# Patient Record
Sex: Female | Born: 1945 | Hispanic: No | Marital: Married | Smoking: Never smoker
Health system: Southern US, Community
[De-identification: ages and names within clinical notes are randomized; demographics above are authoritative.]

## PROBLEM LIST (undated history)

## (undated) DIAGNOSIS — F32A Depression, unspecified: Secondary | ICD-10-CM

## (undated) DIAGNOSIS — K7689 Other specified diseases of liver: Secondary | ICD-10-CM

## (undated) DIAGNOSIS — C7A092 Malignant carcinoid tumor of the stomach: Secondary | ICD-10-CM

## (undated) DIAGNOSIS — E039 Hypothyroidism, unspecified: Secondary | ICD-10-CM

## (undated) DIAGNOSIS — F329 Major depressive disorder, single episode, unspecified: Secondary | ICD-10-CM

## (undated) DIAGNOSIS — I272 Pulmonary hypertension, unspecified: Secondary | ICD-10-CM

## (undated) DIAGNOSIS — R55 Syncope and collapse: Secondary | ICD-10-CM

## (undated) DIAGNOSIS — E785 Hyperlipidemia, unspecified: Secondary | ICD-10-CM

## (undated) DIAGNOSIS — D649 Anemia, unspecified: Secondary | ICD-10-CM

## (undated) DIAGNOSIS — K219 Gastro-esophageal reflux disease without esophagitis: Secondary | ICD-10-CM

## (undated) HISTORY — PX: ABDOMINAL HYSTERECTOMY: SHX81

---

## 2005-09-21 ENCOUNTER — Other Ambulatory Visit: Admission: RE | Admit: 2005-09-21 | Discharge: 2005-09-21 | Payer: Self-pay | Admitting: *Deleted

## 2013-07-18 DEATH — deceased

## 2014-05-12 ENCOUNTER — Ambulatory Visit: Payer: Self-pay | Admitting: Ophthalmology

## 2014-05-12 DIAGNOSIS — I499 Cardiac arrhythmia, unspecified: Secondary | ICD-10-CM

## 2014-05-12 LAB — HEMOGLOBIN: HGB: 12.9 g/dL (ref 12.0–16.0)

## 2014-05-26 ENCOUNTER — Ambulatory Visit: Payer: Self-pay | Admitting: Ophthalmology

## 2015-04-10 NOTE — Op Note (Signed)
PATIENT NAME:  Tanya Wells, Tanya Wells MR#:  [id number] DATE OF BIRTH:  12-22-1945  DATE OF PROCEDURE:  05/26/2014  PREOPERATIVE DIAGNOSIS: Visually significant cataract of the left eye.   POSTOPERATIVE DIAGNOSIS: Visually significant cataract of the left eye.   OPERATIVE PROCEDURE: Cataract extraction by phacoemulsification with implant of intraocular lens to right eye.   SURGEON: Birder Robson, MD.   ANESTHESIA:  1.  Managed anesthesia care.  2.  Topical tetracaine drops followed by 2% Xylocaine jelly applied in the preoperative holding area.   COMPLICATIONS: None.   TECHNIQUE:  Stop and chop.  DESCRIPTION OF PROCEDURE: The patient was examined and consented in the preoperative holding area where the aforementioned topical anesthesia was applied to the left eye and then brought back to the Operating Room where the left eye was prepped and draped in the usual sterile ophthalmic fashion and a lid speculum was placed. A paracentesis was created with the side port blade and the anterior chamber was filled with viscoelastic. A near clear corneal incision was performed with the steel keratome. A continuous curvilinear capsulorrhexis was performed with a cystotome followed by the capsulorrhexis forceps. Hydrodissection and hydrodelineation were carried out with BSS on a blunt cannula. The lens was removed in a stop and chop technique and the remaining cortical material was removed with the irrigation-aspiration handpiece. The capsular bag was inflated with viscoelastic and the Tecnis ZCB00 20.0-diopter lens, serial number [id number] was placed in the capsular bag without complication. The remaining viscoelastic was removed from the eye with the irrigation-aspiration handpiece. The wounds were hydrated. The anterior chamber was flushed with Miostat and the eye was inflated to physiologic pressure. 0.1 mL of cefuroxime concentration 10 mg/mL was placed in the anterior chamber. The wounds were found to be water  tight. The eye was dressed with Vigamox. The patient was given protective glasses to wear throughout the day and a shield with which to sleep tonight. The patient was also given drops with which to begin a drop regimen today and will follow-up with me in one day.      ____________________________ Livingston Diones. Dandy Lazaro, MD wlp:dmm D: 05/26/2014 15:29:23 ET T: 05/26/2014 19:23:07 ET JOB#: [id number]  cc: Ausencio Vaden L. Dvante Hands, MD, <Dictator> Livingston Diones Sheppard Luckenbach MD ELECTRONICALLY SIGNED 05/29/2014 12:50

## 2017-03-25 DIAGNOSIS — C7A092 Malignant carcinoid tumor of the stomach: Secondary | ICD-10-CM | POA: Insufficient documentation

## 2017-03-25 NOTE — Progress Notes (Signed)
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  INTERVAL HISTORY: Patient is a 71 year old female who initially presented with episodes of flushing and "tingling" related to every time she ate. Subsequent workup included a CT scan as well as a 24-hour urine 5 HIAA. CT revealed an enhancing soft tissue mass measuring 3.3 x 2.6 x 4.2 cm in the gastric fundus as well as at least 14 identifiable lesions in the liver. Her urine 5 HIAA was also elevated. Patient otherwise feels well. She has no neurologic complaints. She denies any fevers. She denies any nausea, but does have occasional vomiting after she eats. She denies any constipation or diarrhea. She does not complain of pain. She has no chest pain or shortness of breath. She has no urinary complaints. Patient otherwise feels well and offers no further specific complaints.  REVIEW OF SYSTEMS:   Review of Systems  Constitutional: Positive for diaphoresis. Negative for chills, fever, malaise/fatigue and weight loss.  Respiratory: Negative.  Negative for cough and shortness of breath.   Cardiovascular: Negative.  Negative for chest pain and leg swelling.  Gastrointestinal: Positive for vomiting. Negative for abdominal pain, constipation, diarrhea and nausea.  Genitourinary: Negative.   Musculoskeletal: Negative.   Neurological: Positive for tingling and sensory change. Negative for weakness.  Psychiatric/Behavioral: The patient is nervous/anxious.     As per HPI. Otherwise, a complete review of systems is negative.  PAST MEDICAL HISTORY: Past Medical History:  Diagnosis Date  . Depression   . GERD (gastroesophageal reflux disease)   .  Hypothyroidism   . Liver cyst     PAST SURGICAL HISTORY: Past Surgical History:  Procedure Laterality Date  . ABDOMINAL HYSTERECTOMY      FAMILY HISTORY: Family History  Problem Relation Age of Onset  . Heart disease Mother   . Hypertension Mother   . Skin cancer Father   . Diabetes Sister   . Hypertension Sister   . Diabetes Brother     ADVANCED DIRECTIVES (Y/N):  N  HEALTH MAINTENANCE: Social History  Substance Use Topics  . Smoking status: Never Smoker  . Smokeless tobacco: Never Used  . Alcohol use No     Colonoscopy:  PAP:  Bone density:  Lipid panel:  Allergies  Allergen Reactions  . Codeine Nausea Only    Current Outpatient Prescriptions  Medication Sig Dispense Refill  . atorvastatin (LIPITOR) 20 MG tablet Take 20 mg by mouth.    . benzonatate (TESSALON) 100 MG capsule Take 100 mg by mouth.    . bimatoprost (LUMIGAN) 0.01 % SOLN     . Brinzolamide-Brimonidine (SIMBRINZA) 1-0.2 % SUSP INSTILL 1 DROP IN BOTH EYES 3 TIMES A DAY    . Calcium Carb-Ergocalciferol 500-200 MG-UNIT TABS Take by mouth.    . cephALEXin (KEFLEX) 250 MG capsule Take 250 mg by mouth.    . cyanocobalamin (,VITAMIN B-12,) 1000 MCG/ML injection Inject into the muscle.    . DOCOSAHEXAENOIC ACID PO Take by mouth.    . dorzolamide-timolol (COSOPT) 22.3-6.8 MG/ML ophthalmic solution Administer 1 drop to both eyes Two (2) times a day.    . estradiol (ESTRACE) 0.1 MG/GM vaginal cream Place vaginally.    Marland Kitchen estradiol (ESTRACE) 0.5 MG tablet TAKE 1 TABLET  EVERY DAY    . ferrous sulfate 325 (65 FE) MG tablet Take 325 mg by mouth.    Marland Kitchen FLUoxetine (PROZAC) 20 MG capsule Take 20 mg by mouth.    . fluticasone (FLONASE) 50 MCG/ACT nasal spray SPRAY 2 SPRAYS IN EACH NOSTRIL DAILY    . latanoprost (XALATAN) 0.005 % ophthalmic solution INSTILL ONE DROP TO BOTH EYES EVERY NIGHT    . levothyroxine (SYNTHROID, LEVOTHROID) 150 MCG tablet Take by mouth.     No current facility-administered medications for  this visit.     OBJECTIVE: Vitals:   03/26/17 0911  BP: 128/74  Pulse: (!) 48  Resp: 18  Temp: 97.6 F (36.4 C)     Body mass index is 28.14 kg/m.    ECOG FS:0 - Asymptomatic  General: Well-developed, well-nourished, no acute distress. Eyes: Pink conjunctiva, anicteric sclera. HEENT: Normocephalic, moist mucous membranes, clear oropharnyx. Lungs: Clear to auscultation bilaterally. Heart: Regular rate and rhythm. No rubs, murmurs, or gallops. Abdomen: Soft, nontender, nondistended. No organomegaly noted, normoactive bowel sounds. Musculoskeletal: No edema, cyanosis, or clubbing. Neuro: Alert, answering all questions appropriately. Cranial nerves grossly intact. Skin: No rashes or petechiae noted. Psych: Normal affect. Lymphatics: No cervical, calvicular, axillary or inguinal LAD.   LAB RESULTS:  Lab Results  Component Value Date   NA 136 03/26/2017   K 4.3 03/26/2017   CL 104 03/26/2017   CO2 26 03/26/2017   GLUCOSE 108 (H) 03/26/2017   BUN 13 03/26/2017   CREATININE 0.72 03/26/2017   CALCIUM 9.1 03/26/2017   PROT 7.3 03/26/2017   ALBUMIN 4.1 03/26/2017   AST 28 03/26/2017   ALT 31 03/26/2017   ALKPHOS 85 03/26/2017   BILITOT 0.5 03/26/2017   GFRNONAA >60 03/26/2017   GFRAA >60 03/26/2017    Lab Results  Component Value Date   WBC 8.0 03/26/2017   NEUTROABS 5.1 03/26/2017   HGB 12.8 03/26/2017   HCT 37.4 03/26/2017   MCV 92.6 03/26/2017   PLT 199 03/26/2017     STUDIES: US Biopsy  Result Date: 01-Apr-2017 INDICATION: Gastric lesion.  Liver lesions.  Flushing. EXAM: ULTRASOUND-GUIDED BIOPSY OF A RIGHT LOBE LIVER LESION.  CORE. MEDICATIONS: None. ANESTHESIA/SEDATION: Fentanyl 50 mcg IV; Versed 1 mg IV Moderate Sedation Time:  10 The patient was continuously monitored during the procedure by the interventional radiology nurse under my direct supervision. FLUOROSCOPY TIME:  None. COMPLICATIONS: None immediate. PROCEDURE: Informed written consent was obtained  from the patient after a thorough discussion of the procedural risks, benefits and alternatives. All questions were addressed. Maximal Sterile Barrier Technique was utilized including caps, mask, sterile gowns, sterile gloves, sterile drape, hand hygiene and skin antiseptic. A timeout was performed prior to the initiation of the procedure. The right upper quadrant was prepped with ChloraPrep in a sterile fashion, and a sterile drape was applied covering the operative field. A sterile gown and sterile gloves were used for the procedure. Under sonographic guidance, an 17 gauge guide needle was advanced into the right lobe liver lesion. Subsequently 3 18 gauge core biopsies were obtained. Gel-Foam slurry was injected into the needle tract. The guide needle was removed. Final imaging was performed. Patient tolerated the procedure well without complication. Vital sign monitoring by nursing staff during the procedure will continue as patient is in the special procedures unit for post procedure observation. FINDINGS: The images document guide needle placement within the right lobe liver lesion. Post biopsy images demonstrate no hemorrhage. IMPRESSION: Successful ultrasound-guided core biopsy of a right  lobe liver lesion. Electronically Signed   By: Marybelle Killings M.D.   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  PLAN:    1. 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 Her 24-hour urine 5 HIAA level was elevated at 9.6 (normal range less than 8.0). Chromogranin A levels are significantly elevated at 667. Have ordered an ultrasound-guided biopsy of her liver to confirm the diagnosis. Also have ordered a carcinoid specific gallium PET scan for  further evaluation. Patient will return to clinic one week after her biopsy to discuss the results and treatment planning. For initial treatment given her symptoms of carcinoid syndrome as well as tumor control, will use 120mg  SQ Somatuline Depot every 28 days. If patient has progressive disease, can consider Afinitor 10 mg daily.  Approximately 45 minutes was spent in discussion of which greater than 50% was consultation.  Patient expressed understanding and was in agreement with this plan. She also understands that She can call clinic at any time with any questions, concerns, or complaints.    Lloyd Huger, MD   04/01/2017 11:28 PM

## 2017-03-26 ENCOUNTER — Encounter: Payer: Self-pay | Admitting: Oncology

## 2017-03-26 ENCOUNTER — Other Ambulatory Visit: Payer: Self-pay | Admitting: Oncology

## 2017-03-26 ENCOUNTER — Inpatient Hospital Stay: Payer: Medicare Other

## 2017-03-26 ENCOUNTER — Encounter (INDEPENDENT_AMBULATORY_CARE_PROVIDER_SITE_OTHER): Payer: Self-pay

## 2017-03-26 ENCOUNTER — Inpatient Hospital Stay: Payer: Medicare Other | Attending: Oncology | Admitting: Oncology

## 2017-03-26 DIAGNOSIS — K219 Gastro-esophageal reflux disease without esophagitis: Secondary | ICD-10-CM | POA: Insufficient documentation

## 2017-03-26 DIAGNOSIS — Z79899 Other long term (current) drug therapy: Secondary | ICD-10-CM | POA: Diagnosis not present

## 2017-03-26 DIAGNOSIS — R232 Flushing: Secondary | ICD-10-CM | POA: Insufficient documentation

## 2017-03-26 DIAGNOSIS — C7A092 Malignant carcinoid tumor of the stomach: Secondary | ICD-10-CM

## 2017-03-26 DIAGNOSIS — R61 Generalized hyperhidrosis: Secondary | ICD-10-CM | POA: Insufficient documentation

## 2017-03-26 DIAGNOSIS — C7B02 Secondary carcinoid tumors of liver: Secondary | ICD-10-CM | POA: Diagnosis not present

## 2017-03-26 DIAGNOSIS — Z808 Family history of malignant neoplasm of other organs or systems: Secondary | ICD-10-CM | POA: Diagnosis not present

## 2017-03-26 DIAGNOSIS — R112 Nausea with vomiting, unspecified: Secondary | ICD-10-CM | POA: Diagnosis not present

## 2017-03-26 DIAGNOSIS — R200 Anesthesia of skin: Secondary | ICD-10-CM | POA: Diagnosis not present

## 2017-03-26 DIAGNOSIS — R1110 Vomiting, unspecified: Secondary | ICD-10-CM | POA: Diagnosis not present

## 2017-03-26 DIAGNOSIS — F329 Major depressive disorder, single episode, unspecified: Secondary | ICD-10-CM

## 2017-03-26 DIAGNOSIS — E039 Hypothyroidism, unspecified: Secondary | ICD-10-CM | POA: Diagnosis not present

## 2017-03-26 LAB — COMPREHENSIVE METABOLIC PANEL
ALT: 31 U/L (ref 14–54)
AST: 28 U/L (ref 15–41)
Albumin: 4.1 g/dL (ref 3.5–5.0)
Alkaline Phosphatase: 85 U/L (ref 38–126)
Anion gap: 6 (ref 5–15)
BUN: 13 mg/dL (ref 6–20)
CALCIUM: 9.1 mg/dL (ref 8.9–10.3)
CO2: 26 mmol/L (ref 22–32)
CREATININE: 0.72 mg/dL (ref 0.44–1.00)
Chloride: 104 mmol/L (ref 101–111)
GFR calc non Af Amer: >60 mL/min (ref >60)
GLUCOSE: 108 mg/dL — AB (ref 65–99)
Potassium: 4.3 mmol/L (ref 3.5–5.1)
SODIUM: 136 mmol/L (ref 135–145)
Total Bilirubin: 0.5 mg/dL (ref 0.3–1.2)
Total Protein: 7.3 g/dL (ref 6.5–8.1)

## 2017-03-26 LAB — CBC WITH DIFFERENTIAL/PLATELET
Basophils Absolute: 0.1 10*3/uL (ref 0–0.1)
Basophils Relative: 1 %
EOS ABS: 0.1 10*3/uL (ref 0–0.7)
Eosinophils Relative: 1 %
HCT: 37.4 % (ref 35.0–47.0)
HEMOGLOBIN: 12.8 g/dL (ref 12.0–16.0)
LYMPHS ABS: 2.2 10*3/uL (ref 1.0–3.6)
LYMPHS PCT: 27 %
MCH: 31.8 pg (ref 26.0–34.0)
MCHC: 34.3 g/dL (ref 32.0–36.0)
MCV: 92.6 fL (ref 80.0–100.0)
Monocytes Absolute: 0.5 10*3/uL (ref 0.2–0.9)
Monocytes Relative: 6 %
Neutro Abs: 5.1 10*3/uL (ref 1.4–6.5)
Neutrophils Relative %: 65 %
Platelets: 199 10*3/uL (ref 150–440)
RBC: 4.04 MIL/uL (ref 3.80–5.20)
RDW: 12.5 % (ref 11.5–14.5)
WBC: 8 10*3/uL (ref 3.6–11.0)

## 2017-03-26 LAB — PROTIME-INR
INR: 1.03
Prothrombin Time: 13.5 seconds (ref 11.4–15.2)

## 2017-03-26 LAB — APTT: aPTT: 28 seconds (ref 24–36)

## 2017-03-26 NOTE — Progress Notes (Signed)
  Oncology Nurse Navigator Documentation Met with Tanya Wells and her spouse during and after consult with Dr. Grayland Ormond. Introduced Therapist, nutritional and provided her with my contact information for any future needs. Educated further regarding Liver biopsy and PET scan. Navigator Location: [facility] (03/26/17 0900) Referral date to RadOnc/MedOnc: 03/16/17 (03/26/17 0900) )Navigator Encounter Type: Initial MedOnc (03/26/17 0900)   Abnormal Finding Date: 03/07/17 (03/26/17 0900)                 Patient Visit Type: Initial;MedOnc (03/26/17 0900) Treatment Phase: Abnormal Scans (03/26/17 0900) Barriers/Navigation Needs: Education (03/26/17 0900)   Interventions: Education (03/26/17 0900)     Education Method: Verbal (03/26/17 0900)      Acuity: Level 2 (03/26/17 0900)   Acuity Level 2: Initial guidance, education and coordination as needed;Educational needs;Ongoing guidance and education throughout treatment as needed (03/26/17 0900)     Time Spent with Patient: 30 (03/26/17 0900)

## 2017-03-26 NOTE — Progress Notes (Signed)
  Oncology Nurse Navigator Documentation CD of CT chest/abdomen/pelvis performed 03/07/17 requested from [facility] for uploading. Navigator Location: [facility] (03/26/17 1500)   )Navigator Encounter Type: Diagnostic Results (03/26/17 1500)                                                    Time Spent with Patient: 15 (03/26/17 1500)

## 2017-03-26 NOTE — Progress Notes (Signed)
Patient is here for a new patient evaluation. She does mention when she eats she turns red and that is with anything that she eats.

## 2017-03-28 ENCOUNTER — Other Ambulatory Visit: Payer: Self-pay | Admitting: *Deleted

## 2017-03-28 DIAGNOSIS — C7A092 Malignant carcinoid tumor of the stomach: Secondary | ICD-10-CM

## 2017-03-28 DIAGNOSIS — C787 Secondary malignant neoplasm of liver and intrahepatic bile duct: Secondary | ICD-10-CM

## 2017-03-29 ENCOUNTER — Ambulatory Visit
Admission: RE | Admit: 2017-03-29 | Discharge: 2017-03-29 | Disposition: A | Discharge status: Home / Self Care | Payer: Medicare Other | Source: Ambulatory Visit | Attending: Oncology | Admitting: Oncology

## 2017-03-29 DIAGNOSIS — R160 Hepatomegaly, not elsewhere classified: Secondary | ICD-10-CM | POA: Diagnosis not present

## 2017-03-29 DIAGNOSIS — C787 Secondary malignant neoplasm of liver and intrahepatic bile duct: Secondary | ICD-10-CM

## 2017-03-29 DIAGNOSIS — D3A8 Other benign neuroendocrine tumors: Secondary | ICD-10-CM | POA: Diagnosis not present

## 2017-03-29 DIAGNOSIS — C7A092 Malignant carcinoid tumor of the stomach: Secondary | ICD-10-CM | POA: Insufficient documentation

## 2017-03-29 HISTORY — DX: Depression, unspecified: F32.A

## 2017-03-29 HISTORY — DX: Other specified diseases of liver: K76.89

## 2017-03-29 HISTORY — DX: Major depressive disorder, single episode, unspecified: F32.9

## 2017-03-29 HISTORY — DX: Gastro-esophageal reflux disease without esophagitis: K21.9

## 2017-03-29 HISTORY — DX: Hypothyroidism, unspecified: E03.9

## 2017-03-29 LAB — CHROMOGRANIN A: Chromogranin A: 667 nmol/L — ABNORMAL HIGH (ref 0–5)

## 2017-03-29 MED ORDER — MIDAZOLAM HCL 2 MG/2ML IJ SOLN
INTRAMUSCULAR | Status: AC | PRN
  Administered 2017-03-29: 1 mg via INTRAVENOUS

## 2017-03-29 MED ORDER — FENTANYL CITRATE (PF) 100 MCG/2ML IJ SOLN
INTRAMUSCULAR | Status: AC | PRN
  Administered 2017-03-29: 50 ug via INTRAVENOUS

## 2017-03-29 MED ORDER — SODIUM CHLORIDE 0.9 % IV SOLN
INTRAVENOUS | Status: DC
  Administered 2017-03-29: 10:00:00 via INTRAVENOUS

## 2017-03-29 NOTE — Procedures (Signed)
Liver biopsy 18 g R lobe EBL 0 Comp 0

## 2017-03-29 NOTE — Discharge Instructions (Signed)
Needle Biopsy, Care After °Refer to this sheet in the next few weeks. These instructions provide you with information about caring for yourself after your procedure. Your health care provider may also give you more specific instructions. Your treatment has been planned according to current medical practices, but problems sometimes occur. Call your health care provider if you have any problems or questions after your procedure. °What can I expect after the procedure? °After your procedure, it is common to have soreness, bruising, or mild pain at the biopsy site. This should go away in a few days. °Follow these instructions at home: °· Rest as directed by your health care provider. °· Take medicines only as directed by your health care provider. °· There are many different ways to close and cover the biopsy site, including stitches (sutures), skin glue, and adhesive strips. Follow your health care provider's instructions about: °¨ Biopsy site care. °¨ Bandage (dressing) changes and removal. °¨ Biopsy site closure removal. °· Check your biopsy site every day for signs of infection. Watch for: °¨ Redness, swelling, or pain. °¨ Fluid, blood, or pus. °Contact a health care provider if: °· You have a fever. °· You have redness, swelling, or pain at the biopsy site that lasts longer than a few days. °· You have fluid, blood, or pus coming from the biopsy site. °· You feel nauseous. °· You vomit. °Get help right away if: °· You have shortness of breath. °· You have trouble breathing. °· You have chest pain. °· You feel dizzy or you faint. °· You have bleeding that does not stop with pressure or a bandage. °· You cough up blood. °· You have pain in your abdomen. °This information is not intended to replace advice given to you by your health care provider. Make sure you discuss any questions you have with your health care provider. °Document Released: 04/20/2015 Document Revised: 05/11/2016 Document Reviewed:  11/30/2014 °Elsevier Interactive Patient Education © 2017 [employer] ° °

## 2017-03-29 NOTE — H&P (Signed)
Chief Complaint: Patient was seen in consultation today for No chief complaint on file.  at the request of Finnegan,Timothy J  Referring Physician(s): Finnegan,Timothy J  Supervising Physician: Marybelle Killings  Patient Status: [facility] - Out-pt  History of Present Illness: Tanya Wells is a 71 y.o. female who presented with facial and trunk flushing. She was found to have a gastric mass with liver lesions. She has not undergone endoscopy.  Past Medical History:  Diagnosis Date  . Depression   . GERD (gastroesophageal reflux disease)   . Hypothyroidism   . Liver cyst     Past Surgical History:  Procedure Laterality Date  . ABDOMINAL HYSTERECTOMY      Allergies: Codeine  Medications: Prior to Admission medications   Medication Sig Start Date End Date Taking? Authorizing Provider  atorvastatin (LIPITOR) 20 MG tablet Take 20 mg by mouth. 10/17/16 10/17/17 Yes Historical Provider, MD  bimatoprost (LUMIGAN) 0.01 % SOLN  01/19/15  Yes Historical Provider, MD  Brinzolamide-Brimonidine (SIMBRINZA) 1-0.2 % SUSP INSTILL 1 DROP IN BOTH EYES 3 TIMES A DAY 06/22/16  Yes Historical Provider, MD  Calcium Carb-Ergocalciferol 500-200 MG-UNIT TABS Take by mouth.   Yes Historical Provider, MD  DOCOSAHEXAENOIC ACID PO Take by mouth.   Yes Historical Provider, MD  estradiol (ESTRACE) 0.5 MG tablet TAKE 1 TABLET EVERY DAY 11/07/16  Yes Historical Provider, MD  ferrous sulfate 325 (65 FE) MG tablet Take 325 mg by mouth.   Yes Historical Provider, MD  FLUoxetine (PROZAC) 20 MG capsule Take 20 mg by mouth. 10/12/16  Yes Historical Provider, MD  fluticasone (FLONASE) 50 MCG/ACT nasal spray SPRAY 2 SPRAYS IN EACH NOSTRIL DAILY 03/02/17  Yes Historical Provider, MD  levothyroxine (SYNTHROID, LEVOTHROID) 150 MCG tablet Take by mouth. 01/18/17  Yes Historical Provider, MD  benzonatate (TESSALON) 100 MG capsule Take 100 mg by mouth. 10/31/16 10/31/17  Historical Provider, MD  cephALEXin (KEFLEX) 250 MG capsule  Take 250 mg by mouth. 07/17/16   Historical Provider, MD  cyanocobalamin (,VITAMIN B-12,) 1000 MCG/ML injection Inject into the muscle. 03/29/17   Historical Provider, MD  dorzolamide-timolol (COSOPT) 22.3-6.8 MG/ML ophthalmic solution Administer 1 drop to both eyes Two (2) times a day. 02/11/14   Historical Provider, MD  estradiol (ESTRACE) 0.1 MG/GM vaginal cream Place vaginally. 07/17/16 07/17/17  Historical Provider, MD  latanoprost (XALATAN) 0.005 % ophthalmic solution INSTILL ONE DROP TO BOTH EYES EVERY NIGHT 09/28/15   Historical Provider, MD     Family History  Problem Relation Age of Onset  . Heart disease Mother   . Hypertension Mother   . Skin cancer Father   . Diabetes Sister   . Hypertension Sister   . Diabetes Brother     Social History   Social History  . Marital status: Married    Spouse name: N/A  . Number of children: N/A  . Years of education: N/A   Social History Main Topics  . Smoking status: Never Smoker  . Smokeless tobacco: Never Used  . Alcohol use No  . Drug use: No  . Sexual activity: No   Other Topics Concern  . None   Social History Narrative  . None     Review of Systems: A 12 point ROS discussed and pertinent positives are indicated in the HPI above.  All other systems are negative.  Review of Systems  Vital Signs: BP 125/66   Pulse (!) 51   SpO2 99%   Physical Exam  Constitutional: She is oriented  to person, place, and time. She appears well-developed and well-nourished.  Cardiovascular: Normal rate and regular rhythm.   Pulmonary/Chest: Effort normal and breath sounds normal.  Neurological: She is alert and oriented to person, place, and time.    Mallampati Score: 1    Imaging: No results found.  Labs:  CBC:  Recent Labs  03/26/17 1002  WBC 8.0  HGB 12.8  HCT 37.4  PLT 199    COAGS:  Recent Labs  03/26/17 1002  INR 1.03  APTT 28    BMP:  Recent Labs  03/26/17 1002  NA 136  K 4.3  CL 104  CO2 26    GLUCOSE 108*  BUN 13  CALCIUM 9.1  CREATININE 0.72  GFRNONAA >60  GFRAA >60    LIVER FUNCTION TESTS:  Recent Labs  03/26/17 1002  BILITOT 0.5  AST 28  ALT 31  ALKPHOS 85  PROT 7.3  ALBUMIN 4.1    TUMOR MARKERS: No results for input(s): AFPTM, CEA, CA199, CHROMGRNA in the last 8760 hours.  Assessment and Plan:  Gastric and liver lesions are worrisome for carcinoid tumor. Liver biopsy is to follow.  Thank you for this interesting consult.  I greatly enjoyed meeting Tanya Wells and look forward to participating in their care.  A copy of this report was sent to the requesting provider on this date.  Electronically Signed: Win Guajardo, ART A 03/29/2017, 9:31 AM   I spent a total of  30 Minutes   in face to face in clinical consultation, greater than 50% of which was counseling/coordinating care for liver biopsy.

## 2017-04-01 NOTE — Progress Notes (Signed)
START ON PATHWAY REGIMEN -

## 2017-04-02 ENCOUNTER — Other Ambulatory Visit: Payer: Self-pay | Admitting: Pathology

## 2017-04-02 LAB — SURGICAL PATHOLOGY

## 2017-04-03 ENCOUNTER — Encounter (HOSPITAL_COMMUNITY): Payer: Self-pay | Admitting: Radiology

## 2017-04-03 ENCOUNTER — Encounter (HOSPITAL_COMMUNITY)
Admission: RE | Admit: 2017-04-03 | Discharge: 2017-04-03 | Disposition: A | Discharge status: Home / Self Care | Payer: Medicare Other | Source: Ambulatory Visit | Attending: Oncology | Admitting: Oncology

## 2017-04-03 DIAGNOSIS — C7A092 Malignant carcinoid tumor of the stomach: Secondary | ICD-10-CM | POA: Insufficient documentation

## 2017-04-03 MED ORDER — GALLIUM GA 68 DOTATATE IV KIT: 4.6000 | PACK | Freq: Once | INTRAVENOUS | Status: DC

## 2017-04-04 NOTE — Progress Notes (Signed)
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  INTERVAL HISTORY: Patient returns to clinic today discuss her biopsy results, her PET scan results, and treatment planning. She continues to have increased flushing and diaphoresis. She also has occasional diarrhea. She has no neurologic complaints. She denies any fevers. She denies any nausea, but does have occasional vomiting after she eats. She does not complain of pain. She has no chest pain or shortness of breath. She has no urinary complaints. Patient otherwise feels well and offers no further specific complaints.  REVIEW OF SYSTEMS:   Review of Systems  Constitutional: Positive for diaphoresis. Negative for chills, fever, malaise/fatigue and weight loss.  Respiratory: Negative.  Negative for cough and shortness of breath.   Cardiovascular: Negative.  Negative for chest pain and leg swelling.  Gastrointestinal: Positive for vomiting. Negative for abdominal pain, constipation, diarrhea and nausea.  Genitourinary: Negative.   Musculoskeletal: Negative.   Neurological: Positive for tingling and sensory change. Negative for weakness.  Psychiatric/Behavioral: The patient is nervous/anxious.     As per HPI. Otherwise, a complete review of systems is negative.  PAST MEDICAL HISTORY: Past Medical History:  Diagnosis Date  . Depression   . GERD (gastroesophageal reflux disease)   . Hypothyroidism   . Liver cyst     PAST SURGICAL HISTORY: Past Surgical History:  Procedure Laterality Date  . ABDOMINAL HYSTERECTOMY      FAMILY HISTORY: Family History  Problem Relation Age of Onset  . Heart disease Mother   . Hypertension Mother   .  Skin cancer Father   . Diabetes Sister   . Hypertension Sister   . Diabetes Brother     ADVANCED DIRECTIVES (Y/N):  N  HEALTH MAINTENANCE: Social History  Substance Use Topics  . Smoking status: Never Smoker  . Smokeless tobacco: Never Used  . Alcohol use No     Colonoscopy:  PAP:  Bone density:  Lipid panel:  Allergies  Allergen Reactions  . Codeine Nausea Only    Current Outpatient Prescriptions  Medication Sig Dispense Refill  . atorvastatin (LIPITOR) 20 MG tablet Take 20 mg by mouth.    . benzonatate (TESSALON) 100 MG capsule Take 100 mg by mouth.    . bimatoprost (LUMIGAN) 0.01 % SOLN     . Brinzolamide-Brimonidine (SIMBRINZA) 1-0.2 % SUSP INSTILL 1 DROP IN BOTH EYES 3 TIMES A DAY    . Calcium Carb-Ergocalciferol 500-200 MG-UNIT TABS Take by mouth.    . cephALEXin (KEFLEX) 250 MG capsule Take 250 mg by mouth.    . cyanocobalamin (,VITAMIN B-12,) 1000 MCG/ML injection Inject into the muscle.    . DOCOSAHEXAENOIC ACID PO Take by mouth.    . dorzolamide-timolol (COSOPT) 22.3-6.8 MG/ML ophthalmic solution Administer 1 drop to both eyes Two (2) times a day.    . estradiol (ESTRACE) 0.1 MG/GM vaginal cream Place vaginally.    Marland Kitchen estradiol (ESTRACE) 0.5 MG tablet TAKE 1 TABLET EVERY DAY    . ferrous sulfate 325 (65 FE) MG tablet Take 325 mg by mouth.    Marland Kitchen FLUoxetine (PROZAC) 20 MG capsule Take 20 mg by mouth.    . fluticasone (FLONASE) 50 MCG/ACT nasal spray SPRAY 2 SPRAYS IN EACH NOSTRIL DAILY    .  latanoprost (XALATAN) 0.005 % ophthalmic solution INSTILL ONE DROP TO BOTH EYES EVERY NIGHT    . levothyroxine (SYNTHROID, LEVOTHROID) 150 MCG tablet Take by mouth.     No current facility-administered medications for this visit.    Facility-Administered Medications Ordered in Other Visits  Medication Dose Route Frequency Provider Last Rate Last Dose  . Gallium Ga 68 Dotatate (NETSPOT) KIT 4.6 millicurie  4.6 millicurie Intravenous Once Kerby Moors, MD         OBJECTIVE: Vitals:   04/05/17 1149  BP: (!) 149/67  Pulse: (!) 49  Resp: 18  Temp: 98.4 F (36.9 C)     Body mass index is 28.01 kg/m.    ECOG FS:0 - Asymptomatic  General: Well-developed, well-nourished, no acute distress. Eyes: Pink conjunctiva, anicteric sclera. HEENT: Normocephalic, moist mucous membranes, clear oropharnyx. Lungs: Clear to auscultation bilaterally. Heart: Regular rate and rhythm. No rubs, murmurs, or gallops. Abdomen: Soft, nontender, nondistended. No organomegaly noted, normoactive bowel sounds. Musculoskeletal: No edema, cyanosis, or clubbing. Neuro: Alert, answering all questions appropriately. Cranial nerves grossly intact. Skin: Flushed. Psych: Normal affect.   LAB RESULTS:  Lab Results  Component Value Date   NA 136 03/26/2017   K 4.3 03/26/2017   CL 104 03/26/2017   CO2 26 03/26/2017   GLUCOSE 108 (H) 03/26/2017   BUN 13 03/26/2017   CREATININE 0.72 03/26/2017   CALCIUM 9.1 03/26/2017   PROT 7.3 03/26/2017   ALBUMIN 4.1 03/26/2017   AST 28 03/26/2017   ALT 31 03/26/2017   ALKPHOS 85 03/26/2017   BILITOT 0.5 03/26/2017   GFRNONAA >60 03/26/2017   GFRAA >60 03/26/2017    Lab Results  Component Value Date   WBC 8.0 03/26/2017   NEUTROABS 5.1 03/26/2017   HGB 12.8 03/26/2017   HCT 37.4 03/26/2017   MCV 92.6 03/26/2017   PLT 199 03/26/2017     STUDIES: US Biopsy  Result Date: 2017/03/30 INDICATION: Gastric lesion.  Liver lesions.  Flushing. EXAM: ULTRASOUND-GUIDED BIOPSY OF A RIGHT LOBE LIVER LESION.  CORE. MEDICATIONS: None. ANESTHESIA/SEDATION: Fentanyl 50 mcg IV; Versed 1 mg IV Moderate Sedation Time:  10 The patient was continuously monitored during the procedure by the interventional radiology nurse under my direct supervision. FLUOROSCOPY TIME:  None. COMPLICATIONS: None immediate. PROCEDURE: Informed written consent was obtained from the patient after a thorough discussion of the procedural risks, benefits and  alternatives. All questions were addressed. Maximal Sterile Barrier Technique was utilized including caps, mask, sterile gowns, sterile gloves, sterile drape, hand hygiene and skin antiseptic. A timeout was performed prior to the initiation of the procedure. The right upper quadrant was prepped with ChloraPrep in a sterile fashion, and a sterile drape was applied covering the operative field. A sterile gown and sterile gloves were used for the procedure. Under sonographic guidance, an 17 gauge guide needle was advanced into the right lobe liver lesion. Subsequently 3 18 gauge core biopsies were obtained. Gel-Foam slurry was injected into the needle tract. The guide needle was removed. Final imaging was performed. Patient tolerated the procedure well without complication. Vital sign monitoring by nursing staff during the procedure will continue as patient is in the special procedures unit for post procedure observation. FINDINGS: The images document guide needle placement within the right lobe liver lesion. Post biopsy images demonstrate no hemorrhage. IMPRESSION: Successful ultrasound-guided core biopsy of a right lobe liver lesion. Electronically Signed   By: Marybelle Killings M.D.   On: 2017/03/30 10:58   Nm Pet (netspot Ga 68  Dotatate) Skull Base To Mid Thigh  Result Date: 04/04/2017 CLINICAL DATA:  Gastric carcinoid tumor of the stomach. EXAM: NUCLEAR MEDICINE PET SKULL BASE TO THIGH TECHNIQUE: 4.3 mCi Ga 93 DOTATATE was injected intravenously. Full-ring PET imaging was performed from the skull base to thigh after the radiotracer. CT data was obtained and used for attenuation correction and anatomic localization. COMPARISON:  CT 03/07/2017 FINDINGS: NECK No radiotracer activity in neck lymph nodes. CHEST No radiotracer accumulation within mediastinal or hilar lymph nodes. No suspicious pulmonary nodules on the CT scan. ABDOMEN/PELVIS Intense radiotracer activity associated with the gastric mass which measures 4.2  cm and SUV max equal 100.6. There is intense radiotracer activity within multiple round lesions throughout the LEFT and RIGHT hepatic lobes. Example low-density lesion in the posterior RIGHT hepatic lobe measuring 2.7 cm with SUV max equal 131 (image 75 of the fused data set). Example lesion in the central LEFT hepatic lobe with SUV max equal 93 approximately 40 lesions in the liver. Single periportal lymph nodes with radiotracer accumulation posterior to the portal vein and anterior to the IVC with SUV max equal 15.4. Physiologic activity in the adrenal glands spleen and kidneys. No activity in the bowel.  No pelvic adenopathy. Physiologic activity noted in the liver, spleen, adrenal glands and kidneys. SKELETON No focal activity to suggest skeletal metastasis. IMPRESSION: 1. Intense radiotracer accumulation of the the somatostatin receptor specific radiotracer within the gastric mass consistent gastric carcinoid. 2. Multiple lesions throughout the liver with intense radiotracer accumulation consistent with hepatic metastasis. 3. Single periportal metastatic lymph node. Electronically Signed   By: Suzy Bouchard M.D.   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  PLAN:    1. 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 PET scan results reviewed independently and reported as above. Her 24-hour urine 5 HIAA level was elevated at 9.6 (normal range less than 8.0). Chromogranin A levels are significantly elevated at 667.  For initial treatment given her symptoms of carcinoid syndrome as well as tumor control, will use 166m SQ Somatuline Depot every 28 days. Patient will return early next week to receive her first injection. She will then return to clinic in 1 month for repeat laboratory work and further evaluation.  If patient has progressive disease, can  consider Afinitor 10 mg daily.  Approximately 30 minutes was spent in discussion of which greater than 50% was consultation.  Patient expressed understanding and was in agreement with this plan. She also understands that She can call clinic at any time with any questions, concerns, or complaints.    TLloyd Huger MD   04/05/2017 5:57 PM

## 2017-04-05 ENCOUNTER — Inpatient Hospital Stay (HOSPITAL_BASED_OUTPATIENT_CLINIC_OR_DEPARTMENT_OTHER): Payer: Medicare Other | Admitting: Oncology

## 2017-04-05 ENCOUNTER — Inpatient Hospital Stay: Payer: Medicare Other

## 2017-04-05 ENCOUNTER — Encounter

## 2017-04-05 VITALS — BP 149/67 | HR 49 | Temp 98.4°F | Resp 18 | Wt 138.7 lb

## 2017-04-05 DIAGNOSIS — R61 Generalized hyperhidrosis: Secondary | ICD-10-CM | POA: Diagnosis not present

## 2017-04-05 DIAGNOSIS — Z79899 Other long term (current) drug therapy: Secondary | ICD-10-CM

## 2017-04-05 DIAGNOSIS — R232 Flushing: Secondary | ICD-10-CM | POA: Diagnosis not present

## 2017-04-05 DIAGNOSIS — F329 Major depressive disorder, single episode, unspecified: Secondary | ICD-10-CM | POA: Diagnosis not present

## 2017-04-05 DIAGNOSIS — K219 Gastro-esophageal reflux disease without esophagitis: Secondary | ICD-10-CM | POA: Diagnosis not present

## 2017-04-05 DIAGNOSIS — C7B02 Secondary carcinoid tumors of liver: Secondary | ICD-10-CM

## 2017-04-05 DIAGNOSIS — E039 Hypothyroidism, unspecified: Secondary | ICD-10-CM

## 2017-04-05 DIAGNOSIS — R1110 Vomiting, unspecified: Secondary | ICD-10-CM

## 2017-04-05 DIAGNOSIS — Z808 Family history of malignant neoplasm of other organs or systems: Secondary | ICD-10-CM | POA: Diagnosis not present

## 2017-04-05 DIAGNOSIS — C7A092 Malignant carcinoid tumor of the stomach: Secondary | ICD-10-CM

## 2017-04-05 DIAGNOSIS — R200 Anesthesia of skin: Secondary | ICD-10-CM

## 2017-04-05 NOTE — Progress Notes (Signed)
States had episodes of flushing yesterday with feeling of fullness in pelvic area.

## 2017-04-09 ENCOUNTER — Inpatient Hospital Stay: Payer: Medicare Other

## 2017-04-09 DIAGNOSIS — C7A092 Malignant carcinoid tumor of the stomach: Secondary | ICD-10-CM

## 2017-04-09 MED ORDER — LANREOTIDE ACETATE 120 MG/0.5ML ~~LOC~~ SOLN
120.0000 mg | Freq: Once | SUBCUTANEOUS | Status: AC
  Administered 2017-04-09: 120 mg via SUBCUTANEOUS
  Filled 2017-04-09: qty 120

## 2017-05-03 ENCOUNTER — Ambulatory Visit: Admitting: Oncology

## 2017-05-03 ENCOUNTER — Other Ambulatory Visit

## 2017-05-03 ENCOUNTER — Ambulatory Visit

## 2017-05-06 NOTE — Progress Notes (Signed)
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  INTERVAL HISTORY: Patient returns to clinic today for further evaluation and continuation of monthly Lanreotide. Since initiating treatments, her flushing, diaphoresis, and diarrhea have significantly improved but are not yet resolved. She has no neurologic complaints. She denies any fevers. She denies any nausea, but does have occasional vomiting after she eats. She does not complain of pain. She has no chest pain or shortness of breath. She has no urinary complaints. Patient otherwise feels well and offers no further specific complaints.  REVIEW OF SYSTEMS:   Review of Systems  Constitutional: Positive for diaphoresis. Negative for chills, fever, malaise/fatigue and weight loss.  Respiratory: Negative.  Negative for cough and shortness of breath.   Cardiovascular: Negative.  Negative for chest pain and leg swelling.  Gastrointestinal: Positive for vomiting. Negative for abdominal pain, constipation, diarrhea and nausea.  Genitourinary: Negative.   Musculoskeletal: Negative.   Neurological: Positive for tingling and sensory change. Negative for weakness.  Psychiatric/Behavioral: The patient is nervous/anxious.     As per HPI. Otherwise, a complete review of systems is negative.  PAST MEDICAL HISTORY: Past Medical History:  Diagnosis Date  . Depression   . GERD (gastroesophageal reflux disease)   . Hypothyroidism   . Liver cyst     PAST SURGICAL HISTORY: Past Surgical History:  Procedure Laterality Date  . ABDOMINAL HYSTERECTOMY      FAMILY HISTORY: Family History  Problem Relation Age of Onset  . Heart disease Mother   .  Hypertension Mother   . Skin cancer Father   . Diabetes Sister   . Hypertension Sister   . Diabetes Brother     ADVANCED DIRECTIVES (Y/N):  N  HEALTH MAINTENANCE: Social History  Substance Use Topics  . Smoking status: Never Smoker  . Smokeless tobacco: Never Used  . Alcohol use No     Colonoscopy:  PAP:  Bone density:  Lipid panel:  Allergies  Allergen Reactions  . Codeine Nausea Only    Current Outpatient Prescriptions  Medication Sig Dispense Refill  . atorvastatin (LIPITOR) 20 MG tablet Take 20 mg by mouth.    . bimatoprost (LUMIGAN) 0.01 % SOLN     . Brinzolamide-Brimonidine (SIMBRINZA) 1-0.2 % SUSP INSTILL 1 DROP IN BOTH EYES 3 TIMES A DAY    . Calcium Carb-Ergocalciferol 500-200 MG-UNIT TABS Take by mouth.    . cyanocobalamin (,VITAMIN B-12,) 1000 MCG/ML injection Inject into the muscle.    . DOCOSAHEXAENOIC ACID PO Take by mouth.    . dorzolamide-timolol (COSOPT) 22.3-6.8 MG/ML ophthalmic solution Administer 1 drop to both eyes Two (2) times a day.    . estradiol (ESTRACE) 0.5 MG tablet TAKE 1 TABLET EVERY DAY    . FLUoxetine (PROZAC) 20 MG capsule Take 20 mg by mouth.    . fluticasone (FLONASE) 50 MCG/ACT nasal spray SPRAY 2 SPRAYS IN EACH NOSTRIL DAILY    . latanoprost (XALATAN) 0.005 % ophthalmic solution INSTILL ONE DROP TO BOTH EYES EVERY NIGHT    . levothyroxine (SYNTHROID, LEVOTHROID) 150 MCG tablet Take by mouth.    . benzonatate (TESSALON) 100 MG capsule Take 100 mg by mouth.    . cephALEXin (KEFLEX) 250 MG capsule Take 250 mg by  mouth.    . estradiol (ESTRACE) 0.1 MG/GM vaginal cream Place vaginally.    . ferrous sulfate 325 (65 FE) MG tablet Take 325 mg by mouth.     No current facility-administered medications for this visit.     OBJECTIVE: Vitals:   05/07/17 1125  BP: 114/68  Resp: 18  Temp: 97.8 F (36.6 C)     Body mass index is 27.02 kg/m.    ECOG FS:0 - Asymptomatic  General: Well-developed, well-nourished, no acute distress. Eyes:  Pink conjunctiva, anicteric sclera. HEENT: Normocephalic, moist mucous membranes, clear oropharnyx. Lungs: Clear to auscultation bilaterally. Heart: Regular rate and rhythm. No rubs, murmurs, or gallops. Abdomen: Soft, nontender, nondistended. No organomegaly noted, normoactive bowel sounds. Musculoskeletal: No edema, cyanosis, or clubbing. Neuro: Alert, answering all questions appropriately. Cranial nerves grossly intact. Skin: No rashes or petechiae noted. Psych: Normal affect.   LAB RESULTS:  Lab Results  Component Value Date   NA 135 05/07/2017   K 3.9 05/07/2017   CL 102 05/07/2017   CO2 25 05/07/2017   GLUCOSE 213 (H) 05/07/2017   BUN 11 05/07/2017   CREATININE 0.94 05/07/2017   CALCIUM 9.0 05/07/2017   PROT 6.9 05/07/2017   ALBUMIN 4.0 05/07/2017   AST 37 05/07/2017   ALT 32 05/07/2017   ALKPHOS 97 05/07/2017   BILITOT 0.7 05/07/2017   GFRNONAA >60 05/07/2017   GFRAA >60 05/07/2017    Lab Results  Component Value Date   WBC 6.9 05/07/2017   NEUTROABS 5.2 05/07/2017   HGB 13.3 05/07/2017   HCT 39.1 05/07/2017   MCV 87.9 05/07/2017   PLT 185 05/07/2017     STUDIES: No results found.  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  PLAN:    1. 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 PET scan results reviewed independently and reported as above. Her 24-hour urine 5 HIAA level was elevated at 9.6 (normal range less than 8.0). Chromogranin A levels are significantly elevated at 667.  For initial treatment given her symptoms of carcinoid syndrome as well as tumor control, will use 120mg  SQ Somatuline Depot every 28 days. Return to clinic in one and 2 months for lab and treatment and then in 3 months with repeat imaging, continuation of treatment, and further evaluation. If patient has progressive disease, can consider Afinitor 10 mg  daily. 2. Vomiting with eating: Likely secondary to known gastric mass. Patient states she can tolerate a soft diet, therefore a referral was given to dietary for further evaluation and additional recommendations.  Approximately 30 minutes was spent in discussion of which greater than 50% was consultation.  Patient expressed understanding and was in agreement with this plan. She also understands that She can call clinic at any time with any questions, concerns, or complaints.    Lloyd Huger, MD   05/12/2017 8:01 AM

## 2017-05-07 ENCOUNTER — Inpatient Hospital Stay: Payer: Medicare Other | Attending: Oncology

## 2017-05-07 ENCOUNTER — Inpatient Hospital Stay (HOSPITAL_BASED_OUTPATIENT_CLINIC_OR_DEPARTMENT_OTHER): Payer: Medicare Other | Admitting: Oncology

## 2017-05-07 ENCOUNTER — Inpatient Hospital Stay: Payer: Medicare Other

## 2017-05-07 VITALS — BP 114/68 | Temp 97.8°F | Resp 18 | Wt 133.8 lb

## 2017-05-07 DIAGNOSIS — Z79899 Other long term (current) drug therapy: Secondary | ICD-10-CM

## 2017-05-07 DIAGNOSIS — C7A092 Malignant carcinoid tumor of the stomach: Secondary | ICD-10-CM

## 2017-05-07 DIAGNOSIS — E039 Hypothyroidism, unspecified: Secondary | ICD-10-CM | POA: Insufficient documentation

## 2017-05-07 DIAGNOSIS — R110 Nausea: Secondary | ICD-10-CM

## 2017-05-07 DIAGNOSIS — K219 Gastro-esophageal reflux disease without esophagitis: Secondary | ICD-10-CM | POA: Diagnosis not present

## 2017-05-07 DIAGNOSIS — F329 Major depressive disorder, single episode, unspecified: Secondary | ICD-10-CM | POA: Insufficient documentation

## 2017-05-07 DIAGNOSIS — C7B02 Secondary carcinoid tumors of liver: Secondary | ICD-10-CM | POA: Diagnosis not present

## 2017-05-07 LAB — CBC WITH DIFFERENTIAL/PLATELET
Basophils Absolute: 0 10*3/uL (ref 0–0.1)
Basophils Relative: 0 %
EOS ABS: 0 10*3/uL (ref 0–0.7)
EOS PCT: 1 %
HCT: 39.1 % (ref 35.0–47.0)
Hemoglobin: 13.3 g/dL (ref 12.0–16.0)
LYMPHS ABS: 1.3 10*3/uL (ref 1.0–3.6)
LYMPHS PCT: 19 %
MCH: 30 pg (ref 26.0–34.0)
MCHC: 34.1 g/dL (ref 32.0–36.0)
MCV: 87.9 fL (ref 80.0–100.0)
MONO ABS: 0.3 10*3/uL (ref 0.2–0.9)
Monocytes Relative: 4 %
NEUTROS PCT: 76 %
Neutro Abs: 5.2 10*3/uL (ref 1.4–6.5)
PLATELETS: 185 10*3/uL (ref 150–440)
RBC: 4.45 MIL/uL (ref 3.80–5.20)
RDW: 12.5 % (ref 11.5–14.5)
WBC: 6.9 10*3/uL (ref 3.6–11.0)

## 2017-05-07 LAB — COMPREHENSIVE METABOLIC PANEL
ALT: 32 U/L (ref 14–54)
ANION GAP: 8 (ref 5–15)
AST: 37 U/L (ref 15–41)
Albumin: 4 g/dL (ref 3.5–5.0)
Alkaline Phosphatase: 97 U/L (ref 38–126)
BUN: 11 mg/dL (ref 6–20)
CHLORIDE: 102 mmol/L (ref 101–111)
CO2: 25 mmol/L (ref 22–32)
Calcium: 9 mg/dL (ref 8.9–10.3)
Creatinine, Ser: 0.94 mg/dL (ref 0.44–1.00)
GFR calc non Af Amer: >60 mL/min (ref >60)
Glucose, Bld: 213 mg/dL — ABNORMAL HIGH (ref 65–99)
POTASSIUM: 3.9 mmol/L (ref 3.5–5.1)
SODIUM: 135 mmol/L (ref 135–145)
Total Bilirubin: 0.7 mg/dL (ref 0.3–1.2)
Total Protein: 6.9 g/dL (ref 6.5–8.1)

## 2017-05-07 MED ORDER — LANREOTIDE ACETATE 120 MG/0.5ML ~~LOC~~ SOLN
120.0000 mg | Freq: Once | SUBCUTANEOUS | Status: AC
  Administered 2017-05-07: 120 mg via SUBCUTANEOUS
  Filled 2017-05-07: qty 120

## 2017-05-09 ENCOUNTER — Other Ambulatory Visit: Payer: Self-pay

## 2017-05-09 DIAGNOSIS — C7A092 Malignant carcinoid tumor of the stomach: Secondary | ICD-10-CM

## 2017-05-13 LAB — 5 HIAA, QUANTITATIVE, URINE, 24 HOUR
5-HIAA, Ur: 21.6 mg/L
5-HIAA,Quant.,24 Hr Urine: 32.4 mg/24 hr — ABNORMAL HIGH (ref 0.0–14.9)
TOTAL VOLUME: 1500

## 2017-05-15 LAB — CHROMOGRANIN A: Chromogranin A: 625 nmol/L — ABNORMAL HIGH (ref 0–5)

## 2017-05-18 ENCOUNTER — Telehealth: Payer: Self-pay | Admitting: *Deleted

## 2017-05-18 NOTE — Telephone Encounter (Signed)
Called to report that she passed out last evening, was completely out for a few minutes. States she feels better this morning, but still does not fell good, She feels weak but not dizzy. States she is losing weight lost 8 # in 2 weeks. Not eating much. Please advise.

## 2017-05-18 NOTE — Telephone Encounter (Signed)
I called patient and got family member who stated she was not there right now and I asked him to please be sure she contacts her PCP regarding this episode. He states he will tell her

## 2017-05-18 NOTE — Telephone Encounter (Signed)
Did she seek medical attention last night?  Did she have a seizure? She may need a cardiac workup. Everything from 5/21 looks ok.  As for the weight loss, she needs to see dietary.  Can not remember if she has or not.  Thanks.

## 2017-05-18 NOTE — Telephone Encounter (Signed)
I have called her PCP office to sees if they want to work her up for this as she was seen yesterday for Left arm pain

## 2017-05-18 NOTE — Telephone Encounter (Signed)
Ok, thank you

## 2017-05-18 NOTE — Telephone Encounter (Signed)
She did not seek medical attention , she was alone when it happened. She does not feel it was a seizure, she just very weak and "passed out" She did see her PCP yesterday morning about an arm problem.

## 2017-05-21 ENCOUNTER — Inpatient Hospital Stay

## 2017-06-07 ENCOUNTER — Inpatient Hospital Stay: Payer: Medicare Other | Attending: Oncology

## 2017-06-07 ENCOUNTER — Inpatient Hospital Stay: Payer: Medicare Other

## 2017-06-07 DIAGNOSIS — C7A092 Malignant carcinoid tumor of the stomach: Secondary | ICD-10-CM | POA: Diagnosis not present

## 2017-06-07 DIAGNOSIS — Z79899 Other long term (current) drug therapy: Secondary | ICD-10-CM | POA: Insufficient documentation

## 2017-06-07 LAB — CBC WITH DIFFERENTIAL/PLATELET
BASOS ABS: 0 10*3/uL (ref 0–0.1)
Basophils Relative: 1 %
EOS PCT: 1 %
Eosinophils Absolute: 0 10*3/uL (ref 0–0.7)
HEMATOCRIT: 38.1 % (ref 35.0–47.0)
HEMOGLOBIN: 13 g/dL (ref 12.0–16.0)
LYMPHS PCT: 21 %
Lymphs Abs: 1.5 10*3/uL (ref 1.0–3.6)
MCH: 29.1 pg (ref 26.0–34.0)
MCHC: 34.1 g/dL (ref 32.0–36.0)
MCV: 85.3 fL (ref 80.0–100.0)
Monocytes Absolute: 0.4 10*3/uL (ref 0.2–0.9)
Monocytes Relative: 7 %
NEUTROS ABS: 4.9 10*3/uL (ref 1.4–6.5)
NEUTROS PCT: 70 %
PLATELETS: 201 10*3/uL (ref 150–440)
RBC: 4.46 MIL/uL (ref 3.80–5.20)
RDW: 13.4 % (ref 11.5–14.5)
WBC: 6.9 10*3/uL (ref 3.6–11.0)

## 2017-06-07 LAB — COMPREHENSIVE METABOLIC PANEL
ALK PHOS: 167 U/L — AB (ref 38–126)
ALT: 53 U/L (ref 14–54)
AST: 45 U/L — AB (ref 15–41)
Albumin: 3.9 g/dL (ref 3.5–5.0)
Anion gap: 8 (ref 5–15)
BUN: 9 mg/dL (ref 6–20)
CALCIUM: 8.9 mg/dL (ref 8.9–10.3)
CHLORIDE: 104 mmol/L (ref 101–111)
CO2: 24 mmol/L (ref 22–32)
CREATININE: 0.78 mg/dL (ref 0.44–1.00)
GFR calc Af Amer: >60 mL/min (ref >60)
Glucose, Bld: 134 mg/dL — ABNORMAL HIGH (ref 65–99)
Potassium: 4.2 mmol/L (ref 3.5–5.1)
Sodium: 136 mmol/L (ref 135–145)
Total Bilirubin: 0.8 mg/dL (ref 0.3–1.2)
Total Protein: 6.5 g/dL (ref 6.5–8.1)

## 2017-06-07 MED ORDER — LANREOTIDE ACETATE 120 MG/0.5ML ~~LOC~~ SOLN
120.0000 mg | Freq: Once | SUBCUTANEOUS | Status: AC
  Administered 2017-06-07: 120 mg via SUBCUTANEOUS
  Filled 2017-06-07: qty 120

## 2017-06-11 LAB — CHROMOGRANIN A: Chromogranin A: 961 nmol/L — ABNORMAL HIGH (ref 0–5)

## 2017-07-05 ENCOUNTER — Observation Stay: Payer: Medicare Other

## 2017-07-05 ENCOUNTER — Inpatient Hospital Stay: Payer: Medicare Other

## 2017-07-05 ENCOUNTER — Observation Stay (HOSPITAL_BASED_OUTPATIENT_CLINIC_OR_DEPARTMENT_OTHER)
Admission: AD | Admit: 2017-07-05 | Discharge: 2017-07-06 | Disposition: A | Discharge status: Home / Self Care | Payer: Medicare Other | Source: Ambulatory Visit | Attending: Internal Medicine | Admitting: Internal Medicine

## 2017-07-05 DIAGNOSIS — D649 Anemia, unspecified: Secondary | ICD-10-CM | POA: Insufficient documentation

## 2017-07-05 DIAGNOSIS — R0600 Dyspnea, unspecified: Secondary | ICD-10-CM

## 2017-07-05 DIAGNOSIS — I248 Other forms of acute ischemic heart disease: Secondary | ICD-10-CM | POA: Insufficient documentation

## 2017-07-05 DIAGNOSIS — K7689 Other specified diseases of liver: Secondary | ICD-10-CM

## 2017-07-05 DIAGNOSIS — E785 Hyperlipidemia, unspecified: Secondary | ICD-10-CM

## 2017-07-05 DIAGNOSIS — Z885 Allergy status to narcotic agent status: Secondary | ICD-10-CM | POA: Insufficient documentation

## 2017-07-05 DIAGNOSIS — I081 Rheumatic disorders of both mitral and tricuspid valves: Secondary | ICD-10-CM | POA: Insufficient documentation

## 2017-07-05 DIAGNOSIS — I442 Atrioventricular block, complete: Secondary | ICD-10-CM | POA: Diagnosis not present

## 2017-07-05 DIAGNOSIS — D3A00 Benign carcinoid tumor of unspecified site: Secondary | ICD-10-CM | POA: Diagnosis present

## 2017-07-05 DIAGNOSIS — R778 Other specified abnormalities of plasma proteins: Secondary | ICD-10-CM | POA: Insufficient documentation

## 2017-07-05 DIAGNOSIS — E039 Hypothyroidism, unspecified: Secondary | ICD-10-CM

## 2017-07-05 DIAGNOSIS — F329 Major depressive disorder, single episode, unspecified: Secondary | ICD-10-CM | POA: Insufficient documentation

## 2017-07-05 DIAGNOSIS — Z833 Family history of diabetes mellitus: Secondary | ICD-10-CM

## 2017-07-05 DIAGNOSIS — C7A092 Malignant carcinoid tumor of the stomach: Secondary | ICD-10-CM | POA: Insufficient documentation

## 2017-07-05 DIAGNOSIS — Z90710 Acquired absence of both cervix and uterus: Secondary | ICD-10-CM | POA: Insufficient documentation

## 2017-07-05 DIAGNOSIS — R55 Syncope and collapse: Secondary | ICD-10-CM | POA: Diagnosis not present

## 2017-07-05 DIAGNOSIS — C7B00 Secondary carcinoid tumors, unspecified site: Secondary | ICD-10-CM

## 2017-07-05 DIAGNOSIS — Z808 Family history of malignant neoplasm of other organs or systems: Secondary | ICD-10-CM

## 2017-07-05 DIAGNOSIS — Z79899 Other long term (current) drug therapy: Secondary | ICD-10-CM | POA: Insufficient documentation

## 2017-07-05 DIAGNOSIS — R000 Tachycardia, unspecified: Secondary | ICD-10-CM

## 2017-07-05 DIAGNOSIS — E440 Moderate protein-calorie malnutrition: Secondary | ICD-10-CM | POA: Insufficient documentation

## 2017-07-05 DIAGNOSIS — E860 Dehydration: Secondary | ICD-10-CM | POA: Insufficient documentation

## 2017-07-05 DIAGNOSIS — K219 Gastro-esophageal reflux disease without esophagitis: Secondary | ICD-10-CM

## 2017-07-05 DIAGNOSIS — K921 Melena: Secondary | ICD-10-CM | POA: Insufficient documentation

## 2017-07-05 DIAGNOSIS — Z6826 Body mass index (BMI) 26.0-26.9, adult: Secondary | ICD-10-CM | POA: Insufficient documentation

## 2017-07-05 DIAGNOSIS — R42 Dizziness and giddiness: Secondary | ICD-10-CM

## 2017-07-05 DIAGNOSIS — R7989 Other specified abnormal findings of blood chemistry: Secondary | ICD-10-CM

## 2017-07-05 DIAGNOSIS — Z8249 Family history of ischemic heart disease and other diseases of the circulatory system: Secondary | ICD-10-CM

## 2017-07-05 DIAGNOSIS — I9589 Other hypotension: Secondary | ICD-10-CM | POA: Insufficient documentation

## 2017-07-05 DIAGNOSIS — D72829 Elevated white blood cell count, unspecified: Secondary | ICD-10-CM

## 2017-07-05 DIAGNOSIS — R197 Diarrhea, unspecified: Secondary | ICD-10-CM | POA: Insufficient documentation

## 2017-07-05 HISTORY — DX: Malignant carcinoid tumor of the stomach: C7A.092

## 2017-07-05 HISTORY — DX: Hyperlipidemia, unspecified: E78.5

## 2017-07-05 HISTORY — DX: Anemia, unspecified: D64.9

## 2017-07-05 LAB — CREATININE, SERUM
CREATININE: 0.73 mg/dL (ref 0.44–1.00)
GFR calc Af Amer: >60 mL/min (ref >60)
GFR calc non Af Amer: >60 mL/min (ref >60)

## 2017-07-05 LAB — TROPONIN I: Troponin I: 0.04 ng/mL (ref <0.03)

## 2017-07-05 LAB — APTT: aPTT: 26 seconds (ref 24–36)

## 2017-07-05 LAB — FIBRIN DERIVATIVES D-DIMER (ARMC ONLY): FIBRIN DERIVATIVES D-DIMER (ARMC): 5858.51 — AB (ref 0.00–499.00)

## 2017-07-05 LAB — PROTIME-INR
INR: 1.24
Prothrombin Time: 15.7 seconds — ABNORMAL HIGH (ref 11.4–15.2)

## 2017-07-05 MED ORDER — HEPARIN BOLUS VIA INFUSION
3500.0000 [IU] | Freq: Once | INTRAVENOUS | Status: AC
  Administered 2017-07-05: 3500 [IU] via INTRAVENOUS
  Filled 2017-07-05: qty 3500

## 2017-07-05 MED ORDER — ATORVASTATIN CALCIUM 20 MG PO TABS
20.0000 mg | ORAL_TABLET | Freq: Every day | ORAL | Status: DC
  Administered 2017-07-05: 20 mg via ORAL
  Filled 2017-07-05: qty 1

## 2017-07-05 MED ORDER — ENOXAPARIN SODIUM 40 MG/0.4ML ~~LOC~~ SOLN: 40.0000 mg | SUBCUTANEOUS | Status: DC

## 2017-07-05 MED ORDER — LATANOPROST 0.005 % OP SOLN
1.0000 [drp] | Freq: Every day | OPHTHALMIC | Status: DC
  Administered 2017-07-05: 21:00:00 1 [drp] via OPHTHALMIC
  Filled 2017-07-05: qty 2.5

## 2017-07-05 MED ORDER — ONDANSETRON HCL 4 MG/2ML IJ SOLN: 4.0000 mg | Freq: Four times a day (QID) | INTRAMUSCULAR | Status: DC | PRN

## 2017-07-05 MED ORDER — SODIUM CHLORIDE 0.9% FLUSH
3.0000 mL | INTRAVENOUS | Status: DC | PRN
Start: 2017-07-05 — End: 2017-07-06

## 2017-07-05 MED ORDER — ALBUTEROL SULFATE (2.5 MG/3ML) 0.083% IN NEBU: 2.5000 mg | INHALATION_SOLUTION | RESPIRATORY_TRACT | Status: DC | PRN

## 2017-07-05 MED ORDER — SODIUM CHLORIDE 0.9 % IV SOLN: 250.0000 mL | INTRAVENOUS | Status: DC | PRN

## 2017-07-05 MED ORDER — SODIUM CHLORIDE 0.9 % IV SOLN
INTRAVENOUS | Status: DC
  Administered 2017-07-05 – 2017-07-06 (×2): via INTRAVENOUS

## 2017-07-05 MED ORDER — FLUTICASONE PROPIONATE 50 MCG/ACT NA SUSP
2.0000 | Freq: Every day | NASAL | Status: DC
  Administered 2017-07-05 – 2017-07-06 (×2): 2 via NASAL
  Filled 2017-07-05: qty 16

## 2017-07-05 MED ORDER — ACETAMINOPHEN 650 MG RE SUPP: 650.0000 mg | Freq: Four times a day (QID) | RECTAL | Status: DC | PRN

## 2017-07-05 MED ORDER — ACETAMINOPHEN 325 MG PO TABS: 650.0000 mg | ORAL_TABLET | Freq: Four times a day (QID) | ORAL | Status: DC | PRN

## 2017-07-05 MED ORDER — SODIUM CHLORIDE 0.9% FLUSH
3.0000 mL | Freq: Two times a day (BID) | INTRAVENOUS | Status: DC
  Administered 2017-07-05 – 2017-07-06 (×2): 3 mL via INTRAVENOUS

## 2017-07-05 MED ORDER — IOPAMIDOL (ISOVUE-370) INJECTION 76%
75.0000 mL | Freq: Once | INTRAVENOUS | Status: AC | PRN
  Administered 2017-07-05: 22:00:00 75 mL via INTRAVENOUS

## 2017-07-05 MED ORDER — LEVOTHYROXINE SODIUM 50 MCG PO TABS
150.0000 ug | ORAL_TABLET | Freq: Every day | ORAL | Status: DC
  Administered 2017-07-06: 150 ug via ORAL
  Filled 2017-07-05: qty 1

## 2017-07-05 MED ORDER — ONDANSETRON HCL 4 MG PO TABS: 4.0000 mg | ORAL_TABLET | Freq: Four times a day (QID) | ORAL | Status: DC | PRN

## 2017-07-05 MED ORDER — HEPARIN (PORCINE) IN NACL 100-0.45 UNIT/ML-% IJ SOLN
950.0000 [IU]/h | INTRAMUSCULAR | Status: DC
  Administered 2017-07-05: 950 [IU]/h via INTRAVENOUS
  Filled 2017-07-05: qty 250

## 2017-07-05 NOTE — H&P (Signed)
[place] [facility] [place] NAME: Tanya Wells    MR#:  [id number]  DATE OF BIRTH:  04-01-1946  DATE OF ADMISSION:  07/05/2017  PRIMARY CARE PHYSICIAN: Gennette Pac, NP   REQUESTING/REFERRING PHYSICIAN: Dr. Herschel Senegal in [place] ED.  CHIEF COMPLAINT:  No chief complaint on file.  Near syncope and weakness today. HISTORY OF PRESENT ILLNESS:  Tanya Wells  is a 71 y.o. female with a known history of Malignant carcinoid tumor of stomach, HLP, Depression, GERD, hypothyroidism and liver cyst. The patient was sent from [facility] Emergency Room for Direct Admission. The patient has generalized weakness, dyspnea and almost passed out today. She went to [facility] emergency room, where she was found tachycardia 120, hypotension 80/50, troponin 0.06->0.092 and leukocytosis 14,900. the patient denies any chest pain, palpitation, orthopnea or leg swelling. She denies any recent travel history. Since the patient follow up Dr. Grayland Ormond in [facility], she wants to be transferred to our hospital for direct admission. Her BMP, CXR and UA in [facility] emergency room were unremarkable. Leg ultrasound is negative for DVT. Her vital signs here is in normal range. But d-dimer is elevated.  PAST MEDICAL HISTORY:   Past Medical History:  Diagnosis Date  . Anemia   . Depression   . GERD (gastroesophageal reflux disease)   . Hyperlipidemia   . Hypothyroidism   . Liver cyst   . Malignant carcinoid tumor of stomach ([facility])     PAST SURGICAL HISTORY:   Past Surgical History:  Procedure Laterality Date  . ABDOMINAL HYSTERECTOMY      SOCIAL HISTORY:   Social History  Substance Use Topics  . Smoking status: Never Smoker  . Smokeless tobacco: Never Used  . Alcohol use No    FAMILY HISTORY:   Family History  Problem Relation Age of Onset  . Heart disease Mother   . Hypertension Mother   . Skin cancer Father   . Diabetes Sister   . Hypertension Sister   . Diabetes  Brother     DRUG ALLERGIES:   Allergies  Allergen Reactions  . Codeine Nausea Only    REVIEW OF SYSTEMS:   Review of Systems  Constitutional: Positive for malaise/fatigue. Negative for chills and fever.  HENT: Negative for sore throat.   Eyes: Negative for blurred vision and double vision.  Respiratory: Positive for shortness of breath. Negative for cough, hemoptysis, sputum production, wheezing and stridor.   Cardiovascular: Negative for chest pain, palpitations, orthopnea and leg swelling.  Gastrointestinal: Negative for abdominal pain, blood in stool, diarrhea, melena, nausea and vomiting.  Genitourinary: Negative for dysuria, flank pain and hematuria.  Musculoskeletal: Negative for back pain and joint pain.  Skin: Negative for itching and rash.  Neurological: Positive for dizziness and weakness. Negative for sensory change, focal weakness, seizures, loss of consciousness and headaches.  Endo/Heme/Allergies: Negative for polydipsia.  Psychiatric/Behavioral: Negative for depression. The patient is not nervous/anxious.     MEDICATIONS AT HOME:   Prior to Admission medications   Medication Sig Start Date End Date Taking? Authorizing Provider  atorvastatin (LIPITOR) 20 MG tablet Take 20 mg by mouth. 10/17/16 10/17/17  [provider]  benzonatate (TESSALON) 100 MG capsule Take 100 mg by mouth. 10/31/16 10/31/17  [provider]  bimatoprost (LUMIGAN) 0.01 % SOLN  01/19/15   [provider]  Brinzolamide-Brimonidine (SIMBRINZA) 1-0.2 % SUSP INSTILL 1 DROP IN BOTH EYES 3 TIMES A DAY 06/22/16   [provider]  Calcium  Carb-Ergocalciferol 500-200 MG-UNIT TABS Take by mouth.    [provider]  cephALEXin (KEFLEX) 250 MG capsule Take 250 mg by mouth. 07/17/16   [provider]  cyanocobalamin (,VITAMIN B-12,) 1000 MCG/ML injection Inject into the muscle. 03/29/17   [provider]  DOCOSAHEXAENOIC ACID PO Take by mouth.     [provider]  dorzolamide-timolol (COSOPT) 22.3-6.8 MG/ML ophthalmic solution Administer 1 drop to both eyes Two (2) times a day. 02/11/14   [provider]  estradiol (ESTRACE) 0.1 MG/GM vaginal cream Place vaginally. 07/17/16 07/17/17  [provider]  estradiol (ESTRACE) 0.5 MG tablet TAKE 1 TABLET EVERY DAY 11/07/16   [provider]  ferrous sulfate 325 (65 FE) MG tablet Take 325 mg by mouth.    [provider]  FLUoxetine (PROZAC) 20 MG capsule Take 20 mg by mouth. 10/12/16   [provider]  fluticasone (FLONASE) 50 MCG/ACT nasal spray SPRAY 2 SPRAYS IN EACH NOSTRIL DAILY 03/02/17   [provider]  latanoprost (XALATAN) 0.005 % ophthalmic solution INSTILL ONE DROP TO BOTH EYES EVERY NIGHT 09/28/15   [provider]  levothyroxine (SYNTHROID, LEVOTHROID) 150 MCG tablet Take by mouth. 01/18/17   [provider]      VITAL SIGNS:  Blood pressure 104/86, pulse (!) 56, temperature 97.9 F (36.6 C), resp. rate 19, height 4\' 11"  (1.499 m), weight 130 lb 11.2 oz (59.3 kg), SpO2 98 %.  PHYSICAL EXAMINATION:  Physical Exam  GENERAL:  71 y.o.-year-old patient lying in the bed with no acute distress.  EYES: Pupils equal, round, reactive to light and accommodation. No scleral icterus. Extraocular muscles intact.  HEENT: Head atraumatic, normocephalic. Oropharynx and nasopharynx clear.  NECK:  Supple, no jugular venous distention. No thyroid enlargement, no tenderness.  LUNGS: Normal breath sounds bilaterally, no wheezing, rales,rhonchi or crepitation. No use of accessory muscles of respiration.  CARDIOVASCULAR: S1, S2 normal. No murmurs, rubs, or gallops.  ABDOMEN: Soft, nontender, nondistended. Bowel sounds present. No organomegaly or mass.  EXTREMITIES: No pedal edema, cyanosis, or clubbing.  NEUROLOGIC: Cranial nerves II through XII are intact. Muscle strength 5/5 in all extremities. Sensation intact. Gait not  checked.  PSYCHIATRIC: The patient is alert and oriented x 3.  SKIN: No obvious rash, lesion, or ulcer.   LABORATORY PANEL:   CBC No results for input(s): WBC, HGB, HCT, PLT in the last 168 hours. ------------------------------------------------------------------------------------------------------------------  Chemistries   Recent Labs Lab 07/05/17 1732  CREATININE 0.73   ------------------------------------------------------------------------------------------------------------------  Cardiac Enzymes  Recent Labs Lab 07/05/17 1732  TROPONINI 0.04*   ------------------------------------------------------------------------------------------------------------------  RADIOLOGY:  No results found.    IMPRESSION AND PLAN:   Near syncope with elevated D-dimer, rule out PE. The patient is placed for observation. Follow-up troponin level, carotid duplex and echocardiogram. CT angiogram of the chest to rule out PE. Leg ultrasound didn't show any DVT. Start heparin drip pharmacy to dose.  Elevated troponin. Possible due to demanding ischemia. Follow-up troponin level and echocardiogram.  All the records are reviewed and case discussed with ED provider. Management plans discussed with the patient, Her husband and they are in agreement.  CODE STATUS: Full code  TOTAL TIME TAKING CARE OF THIS PATIENT: 53 minutes.    Demetrios Loll M.D on 07/05/2017 at 8:16 PM  Between 7am to 6pm - Pager - [phone]  After 6pm go to [email or web address] - Technical brewer [facility]  Office  [phone]  CC: Primary care physician; Gennette Pac, NP  Note: This dictation was prepared with Dragon dictation along with smaller phrase technology. Any transcriptional errors that result from this process are unintentional.

## 2017-07-05 NOTE — Progress Notes (Signed)
ANTICOAGULATION CONSULT NOTE - Initial Consult  Pharmacy Consult for heparin Indication: VTE treatment  Allergies  Allergen Reactions  . Codeine Nausea Only    Patient Measurements: Height: 4\' 11"  (149.9 cm) Weight: 130 lb 11.2 oz (59.3 kg) IBW/kg (Calculated) : 43.2 Heparin Dosing Weight: 55.6 kg  Vital Signs: Temp: 97.9 F (36.6 C) (07/19 1957) Temp Source: Oral (07/19 1603) BP: 104/86 (07/19 1957) Pulse Rate: 56 (07/19 1957)  Labs:  Recent Labs  07/05/17 1732  CREATININE 0.73  TROPONINI 0.04*    Estimated Creatinine Clearance: 52 mL/min (by C-G formula based on SCr of 0.73 mg/dL).   Medical History: Past Medical History:  Diagnosis Date  . Anemia   . Depression   . GERD (gastroesophageal reflux disease)   . Hyperlipidemia   . Hypothyroidism   . Liver cyst   . Malignant carcinoid tumor of stomach ([facility])     Medications:  Infusions:  . sodium chloride    . sodium chloride 75 mL/hr at 07/05/17 1700  . heparin      Assessment: 10 yof with elevated serum fibrin derivatives. Pharmacy consulted to dose heparin for VTE. No PTA OAC noted.   Goal of Therapy:  Heparin level 0.3-0.7 units/ml Monitor platelets by anticoagulation protocol: Yes   Plan:  Give 3500 units bolus x 1 Start heparin infusion at 950 units/hr Check anti-Xa level in 6 hours and daily while on heparin Continue to monitor H&H and platelets  Laural Benes, Pharm.D., BCPS Clinical Pharmacist 07/05/2017,8:25 PM

## 2017-07-06 ENCOUNTER — Observation Stay (HOSPITAL_BASED_OUTPATIENT_CLINIC_OR_DEPARTMENT_OTHER)
Admission: AD | Admit: 2017-07-06 | Discharge: 2017-07-06 | Disposition: A | Discharge status: Home / Self Care | Payer: Medicare Other | Source: Ambulatory Visit | Attending: Internal Medicine | Admitting: Internal Medicine

## 2017-07-06 DIAGNOSIS — I361 Nonrheumatic tricuspid (valve) insufficiency: Secondary | ICD-10-CM

## 2017-07-06 DIAGNOSIS — R748 Abnormal levels of other serum enzymes: Secondary | ICD-10-CM | POA: Diagnosis not present

## 2017-07-06 DIAGNOSIS — D3A00 Benign carcinoid tumor of unspecified site: Secondary | ICD-10-CM | POA: Diagnosis present

## 2017-07-06 DIAGNOSIS — R55 Syncope and collapse: Secondary | ICD-10-CM | POA: Diagnosis not present

## 2017-07-06 DIAGNOSIS — R42 Dizziness and giddiness: Secondary | ICD-10-CM | POA: Diagnosis not present

## 2017-07-06 DIAGNOSIS — R7989 Other specified abnormal findings of blood chemistry: Secondary | ICD-10-CM

## 2017-07-06 DIAGNOSIS — E440 Moderate protein-calorie malnutrition: Secondary | ICD-10-CM | POA: Insufficient documentation

## 2017-07-06 DIAGNOSIS — R778 Other specified abnormalities of plasma proteins: Secondary | ICD-10-CM

## 2017-07-06 LAB — CBC
HCT: 33.8 % — ABNORMAL LOW (ref 35.0–47.0)
Hemoglobin: 11.1 g/dL — ABNORMAL LOW (ref 12.0–16.0)
MCH: 28.6 pg (ref 26.0–34.0)
MCHC: 33 g/dL (ref 32.0–36.0)
MCV: 86.7 fL (ref 80.0–100.0)
PLATELETS: 141 10*3/uL — AB (ref 150–440)
RBC: 3.9 MIL/uL (ref 3.80–5.20)
RDW: 15.1 % — AB (ref 11.5–14.5)
WBC: 6.3 10*3/uL (ref 3.6–11.0)

## 2017-07-06 LAB — BASIC METABOLIC PANEL
Anion gap: 7 (ref 5–15)
BUN: 8 mg/dL (ref 6–20)
CALCIUM: 8.1 mg/dL — AB (ref 8.9–10.3)
CHLORIDE: 109 mmol/L (ref 101–111)
CO2: 24 mmol/L (ref 22–32)
CREATININE: 0.7 mg/dL (ref 0.44–1.00)
GFR calc Af Amer: >60 mL/min (ref >60)
Glucose, Bld: 123 mg/dL — ABNORMAL HIGH (ref 65–99)
Potassium: 4 mmol/L (ref 3.5–5.1)
SODIUM: 140 mmol/L (ref 135–145)

## 2017-07-06 LAB — HEPARIN LEVEL (UNFRACTIONATED): Heparin Unfractionated: 1 IU/mL — ABNORMAL HIGH (ref 0.30–0.70)

## 2017-07-06 LAB — TROPONIN I: Troponin I: <0.03 ng/mL (ref <0.03)

## 2017-07-06 LAB — ECHOCARDIOGRAM COMPLETE
HEIGHTINCHES: 59 in
WEIGHTICAEL: 2091.2 [oz_av]

## 2017-07-06 MED ORDER — ENSURE ENLIVE PO LIQD: 237.0000 mL | Freq: Two times a day (BID) | ORAL | Status: DC

## 2017-07-06 MED ORDER — ADULT MULTIVITAMIN W/MINERALS CH: 1.0000 | ORAL_TABLET | Freq: Every day | ORAL | Status: DC

## 2017-07-06 MED ORDER — HEPARIN (PORCINE) IN NACL 100-0.45 UNIT/ML-% IJ SOLN
800.0000 [IU]/h | INTRAMUSCULAR | Status: DC
  Administered 2017-07-06: 800 [IU]/h via INTRAVENOUS

## 2017-07-06 NOTE — Progress Notes (Signed)
*  PRELIMINARY RESULTS* Echocardiogram 2D Echocardiogram has been performed.  Tanya Wells 07/06/2017, 2:53 PM

## 2017-07-06 NOTE — Care Management Obs Status (Signed)
[facility] NOTIFICATION   Patient Details  Name: Tanya Wells MRN: [id number] Date of Birth: February 02, 1946   Medicare Observation Status Notification Given:  Yes    Shelbie Ammons, RN 07/06/2017, 11:27 AM

## 2017-07-06 NOTE — Progress Notes (Signed)
Red allergy arm band in place.   Yellow falls arm band in place.

## 2017-07-06 NOTE — Discharge Summary (Signed)
[place] [facility] [place] NAME: Tanya Wells    MR#:  [id number]  DATE OF BIRTH:  1946-04-26  DATE OF ADMISSION:  07/05/2017   ADMITTING PHYSICIAN: Demetrios Loll, MD  DATE OF DISCHARGE: 07/06/2017  4:02 PM  PRIMARY CARE PHYSICIAN: Gennette Pac, NP   ADMISSION DIAGNOSIS:   Elevated troponin Syncope and Collapes Carcinoid Syndrome  DISCHARGE DIAGNOSIS:   Principal Problem:   Postural dizziness with presyncope Active Problems:   Carcinoid tumor   Elevated troponin   Malnutrition of moderate degree   SECONDARY DIAGNOSIS:   Past Medical History:  Diagnosis Date  . Anemia   . Depression   . GERD (gastroesophageal reflux disease)   . Hyperlipidemia   . Hypothyroidism   . Liver cyst   . Malignant carcinoid tumor of stomach [facility])     HOSPITAL COURSE:   71 year old female with past medical history significant for malignant carcinoid tumor of stomach on monthly lantreotide injections, depression, hyperlipidemia, and GERD presents to the hospital secondary to presyncopal episode.  #1 presyncope and dizziness-secondary to hypotension from dehydration. -Tachycardia and hypotension resolved with IV fluids. -Patient is able to ambulate without any further deficits.  #2 elevated troponin-secondary to demand ischemia from hypotension and tachycardia. Appreciate cardiology consult -D-dimer was elevated, but Dopplers negative for DVT and CT chest negative for PE. -Echocardiogram done and pending at this time  #3 metastatic carcinoid tumor-follows with cancer Center. -On monthly lantreotide injections. Also has diarrhea with some melena occasionally. -No drop in hemoglobin at this time. Follow-up labs at the cancer center  DISCHARGE CONDITIONS:   Guarded  CONSULTS OBTAINED:   Treatment Team:  Deboraha Sprang, MD  DRUG ALLERGIES:   Allergies  Allergen Reactions  . Codeine Nausea Only   DISCHARGE MEDICATIONS:   Allergies as of  07/06/2017      Reactions   Codeine Nausea Only      Medication List    STOP taking these medications   doxycycline 100 MG capsule Commonly known as:  VIBRAMYCIN     TAKE these medications   atorvastatin 20 MG tablet Commonly known as:  LIPITOR Take 20 mg by mouth daily at 6 PM.   dorzolamide-timolol 22.3-6.8 MG/ML ophthalmic solution Commonly known as:  COSOPT Administer 1 drop to both eyes Two (2) times a day.   estradiol 0.5 MG tablet Commonly known as:  ESTRACE TAKE 1 TABLET EVERY DAY   FLUoxetine 20 MG capsule Commonly known as:  PROZAC Take 20 mg by mouth daily.   fluticasone 50 MCG/ACT nasal spray Commonly known as:  FLONASE SPRAY 2 SPRAYS IN EACH NOSTRIL DAILY   latanoprost 0.005 % ophthalmic solution Commonly known as:  XALATAN INSTILL ONE DROP TO BOTH EYES EVERY NIGHT   levothyroxine 125 MCG tablet Commonly known as:  SYNTHROID, LEVOTHROID Take 125 mcg by mouth daily before breakfast.        DISCHARGE INSTRUCTIONS:   1. PCP f/u in 2 weeks 2. F/u with Cancer center in 2 days  DIET:   Cardiac diet  ACTIVITY:   Activity as tolerated  OXYGEN:   Home Oxygen: No.  Oxygen Delivery: room air  DISCHARGE LOCATION:   home   If you experience worsening of your admission symptoms, develop shortness of breath, life threatening emergency, suicidal or homicidal thoughts you must seek medical attention immediately by calling 911 or calling your MD immediately  if symptoms less severe.  You Must read complete instructions/literature along with  all the possible adverse reactions/side effects for all the Medicines you take and that have been prescribed to you. Take any new Medicines after you have completely understood and accpet all the possible adverse reactions/side effects.   Please note  You were cared for by a hospitalist during your hospital stay. If you have any questions about your discharge medications or the care you received while you were in  the hospital after you are discharged, you can call the unit and asked to speak with the hospitalist on call if the hospitalist that took care of you is not available. Once you are discharged, your primary care physician will handle any further medical issues. Please note that NO REFILLS for any discharge medications will be authorized once you are discharged, as it is imperative that you return to your primary care physician (or establish a relationship with a primary care physician if you do not have one) for your aftercare needs so that they can reassess your need for medications and monitor your lab values.    On the day of Discharge:  VITAL SIGNS:   Blood pressure 108/60, pulse (!) 51, temperature 97.9 F (36.6 C), temperature source Oral, resp. rate 20, height 4\' 11"  (1.499 m), weight 59.3 kg (130 lb 11.2 oz), SpO2 98 %.  PHYSICAL EXAMINATION:    GENERAL:  71 y.o.-year-old patient lying in the bed with no acute distress.  EYES: Pupils equal, round, reactive to light and accommodation. No scleral icterus. Extraocular muscles intact.  HEENT: Head atraumatic, normocephalic. Oropharynx and nasopharynx clear.  NECK:  Supple, no jugular venous distention. No thyroid enlargement, no tenderness.  LUNGS: Normal breath sounds bilaterally, no wheezing, rales,rhonchi or crepitation. No use of accessory muscles of respiration.  CARDIOVASCULAR: S1, S2 normal. No murmurs, rubs, or gallops.  ABDOMEN: Soft, non-tender, non-distended. Bowel sounds present. No organomegaly or mass.  EXTREMITIES: No pedal edema, cyanosis, or clubbing.  NEUROLOGIC: Cranial nerves II through XII are intact. Muscle strength 5/5 in all extremities. Sensation intact. Gait not checked.  PSYCHIATRIC: The patient is alert and oriented x 3.  SKIN: No obvious rash, lesion, or ulcer.   DATA REVIEW:   CBC  Recent Labs Lab 07/06/17 0418  WBC 6.3  HGB 11.1*  HCT 33.8*  PLT 141*    Chemistries   Recent Labs Lab  07/06/17 0418  NA 140  K 4.0  CL 109  CO2 24  GLUCOSE 123*  BUN 8  CREATININE 0.70  CALCIUM 8.1*     Microbiology Results  No results found for this or any previous visit.  RADIOLOGY:  Ct Angio Chest Pe W Or Wo Contrast  Result Date: 07/05/2017 CLINICAL DATA:  Dyspnea and tachycardia. Elevated white blood cell count. History of gastric carcinoid tumor. EXAM: CT ANGIOGRAPHY CHEST WITH CONTRAST TECHNIQUE: Multidetector CT imaging of the chest was performed using the standard protocol during bolus administration of intravenous contrast. Multiplanar CT image reconstructions and MIPs were obtained to evaluate the vascular anatomy. CONTRAST:  70 mL Isovue 370 IV COMPARISON:  1. Chest radiograph 07/05/2017 2. PET CT 04/03/2017 3. CT chest 03/07/2017 FINDINGS: Cardiovascular: Contrast injection is sufficient to demonstrate satisfactory opacification of the pulmonary arteries to the segmental level. There is no pulmonary embolus. The main pulmonary artery is within normal limits for size. There is no CT evidence of acute right heart strain. The visualized aorta is normal. There is a normal 3-vessel arch branching pattern. Heart size is normal, without pericardial effusion. There is atherosclerotic calcification in  the coronary arteries. Mediastinum/Nodes: No mediastinal, hilar or axillary lymphadenopathy. The visualized thyroid and thoracic esophageal course are unremarkable. Lungs/Pleura: No pulmonary nodules or masses. No pleural effusion or pneumothorax. No focal airspace consolidation. No focal pleural abnormality. Upper Abdomen: Contrast bolus timing is not optimized for evaluation of the abdominal organs. Known hepatic metastases are not well characterized on this study. Musculoskeletal: No chest wall abnormality. No acute or significant osseous findings. Review of the MIP images confirms the above findings. IMPRESSION: 1. No pulmonary embolus. 2. No other acute thoracic abnormality. 3. Hepatic  metastases demonstrated on prior PET CT are not visible on this study. Electronically Signed   By: Ulyses Jarred M.D.   On: 07/05/2017 22:26     Management plans discussed with the patient, family and they are in agreement.  CODE STATUS:     Code Status Orders        Start     Ordered   07/05/17 1640  Full code  Continuous     07/05/17 1639    Code Status History    Date Active Date Inactive Code Status Order ID Comments User Context   This patient has a current code status but no historical code status.    Advance Directive Documentation     Most Recent Value  Type of Advance Directive  Living will  Pre-existing out of facility DNR order (yellow form or pink MOST form)  -  "MOST" Form in Place?  -      TOTAL TIME TAKING CARE OF THIS PATIENT: 37 minutes.    Tanya Wells M.D on 07/06/2017 at 4:34 PM  Between 7am to 6pm - Pager - [phone]  After 6pm go to [email or web address] - Proofreader  [facility]  Office  [phone]  CC: Primary care physician; Gennette Pac, NP   Note: This dictation was prepared with Dragon dictation along with smaller phrase technology. Any transcriptional errors that result from this process are unintentional.

## 2017-07-06 NOTE — Care Management Note (Signed)
Case Management Note  Patient Details  Name: Tanya Wells MRN: [id number] Date of Birth: 1946-09-17  Subjective/Objective:      Admitted to [facility] under observation status with the diagnosis of syncope. Lives with husband, Delfino Lovett   [phone]). Last seen Elisha Ponder NP at [facility]. Last seen Dr. Grayland Ormond 2 months ago. Prescriptions are filled at [facility] in [facility]. No home health. No skilled nursing. No medical equipment in the home. Last fall was 3-4 months ago. Decreased appetite. Last 9-10 pounds in the last 4-5 months. Takes care of all basic and instrumental activities of daily living herself, can drive, if needed.  Husband will transport.              Action/Plan:  No follow-up needs identified at this time.  Will continue to follow   Expected Discharge Date:                  Expected Discharge Plan:     In-House Referral:     Discharge planning Services     Post Acute Care Choice:    Choice offered to:     DME Arranged:    DME Agency:     HH Arranged:    HH Agency:     Status of Service:     If discussed at [employer] of Stay Meetings, dates discussed:    Additional Comments:  Shelbie Ammons, RN MSN CCM Care Management [phone] 07/06/2017, 11:27 AM

## 2017-07-06 NOTE — Consult Note (Signed)
Cardiology Consultation Note  Patient ID: Tanya Wells, MRN: [id number], DOB/AGE: 03/31/1946 71 y.o. Admit date: 07/05/2017   Date of Consult: 07/06/2017 Primary Physician: Gennette Pac, NP Primary Cardiologist: New to [facility] - consult by Fletcher Anon Requesting Physician: Dr. Bridgett Larsson, MD  Chief Complaint: Weakness/fatgiue Reason for Consult: Elevated troponin  HPI: Tanya Wells is a 70 y.o. female who is being seen today for the evaluation of elevated troponin at the request of Dr. Bridgett Larsson, MD. Patient has a h/o metastatic carcinoid tumor of the stomach followed by Dr. Grayland Ormond, HLD, hypothyroidism, liver cysts, GERD and depression who was transferred from [facility] for weakness and elevated troponin.  No previously known cardiac history. Patient presented to [facility] on 7/19 with generalized weakness, SOB, and presyncope. She was noted to be hypotensive with BP of 80/50, reportedly tachycardic with heart rate os 120 bpm (no strips for review), and had a mildly elevated troponin of 0.06-->0.09 along with a severely elevated D-dimer of > 5000. LE ultrasound at [facility] was negative for DVT. Patient requested to be transferred to [facility] given she follows Dr. Grayland Ormond here. Upon her arrival to [facility] she underwent CTA chest that was negative for PE. Troponin was found to be down trending at 0.04. WBC 6.3, HGB 11.1, PLT 141, SCr 0.70, K+ 4.0, INR 1.24. Vitals stable. No further tachycardia noted. She was started on a heparin gtt by IM given her elevated troponin. Patient has never had any chest pain, SOB, diaphoresis, nausea, vomiting, or syncope. She feels back to her baseline this morning.     Past Medical History:  Diagnosis Date  . Anemia   . Depression   . GERD (gastroesophageal reflux disease)   . Hyperlipidemia   . Hypothyroidism   . Liver cyst   . Malignant carcinoid tumor of stomach ([place])       Most Recent Cardiac Studies: none   Surgical History:  Past Surgical  History:  Procedure Laterality Date  . ABDOMINAL HYSTERECTOMY       Home Meds: Prior to Admission medications   Medication Sig Start Date End Date Taking? Authorizing Provider  atorvastatin (LIPITOR) 20 MG tablet Take 20 mg by mouth. 10/17/16 10/17/17  [provider]  benzonatate (TESSALON) 100 MG capsule Take 100 mg by mouth. 10/31/16 10/31/17  [provider]  bimatoprost (LUMIGAN) 0.01 % SOLN  01/19/15   [provider]  Brinzolamide-Brimonidine (SIMBRINZA) 1-0.2 % SUSP INSTILL 1 DROP IN BOTH EYES 3 TIMES A DAY 06/22/16   [provider]  Calcium Carb-Ergocalciferol 500-200 MG-UNIT TABS Take by mouth.    [provider]  cephALEXin (KEFLEX) 250 MG capsule Take 250 mg by mouth. 07/17/16   [provider]  cyanocobalamin (,VITAMIN B-12,) 1000 MCG/ML injection Inject into the muscle. 03/29/17   [provider]  DOCOSAHEXAENOIC ACID PO Take by mouth.    [provider]  dorzolamide-timolol (COSOPT) 22.3-6.8 MG/ML ophthalmic solution Administer 1 drop to both eyes Two (2) times a day. 02/11/14   [provider]  estradiol (ESTRACE) 0.1 MG/GM vaginal cream Place vaginally. 07/17/16 07/17/17  [provider]  estradiol (ESTRACE) 0.5 MG tablet TAKE 1 TABLET EVERY DAY 11/07/16   [provider]  ferrous sulfate 325 (65 FE) MG tablet Take 325 mg by mouth.    [provider]  FLUoxetine (PROZAC) 20 MG capsule Take 20 mg by mouth. 10/12/16   [provider]  fluticasone (FLONASE) 50 MCG/ACT nasal spray SPRAY 2 SPRAYS IN  EACH NOSTRIL DAILY 03/02/17   [provider]  latanoprost (XALATAN) 0.005 % ophthalmic solution INSTILL ONE DROP TO BOTH EYES EVERY NIGHT 09/28/15   [provider]  levothyroxine (SYNTHROID, LEVOTHROID) 150 MCG tablet Take by mouth. 01/18/17   [provider]    Inpatient Medications:  . atorvastatin  20 mg Oral q1800  . fluticasone  2 spray Each  Nare Daily  . latanoprost  1 drop Both Eyes QHS  . levothyroxine  150 mcg Oral QAC breakfast  . sodium chloride flush  3 mL Intravenous Q12H   . sodium chloride    . sodium chloride 75 mL/hr at 07/06/17 0643  . heparin 800 Units/hr (07/06/17 1610)    Allergies:  Allergies  Allergen Reactions  . Codeine Nausea Only    Social History   Social History  . Marital status: Married    Spouse name: N/A  . Number of children: N/A  . Years of education: N/A   Occupational History  . Not on file.   Social History Main Topics  . Smoking status: Never Smoker  . Smokeless tobacco: Never Used  . Alcohol use No  . Drug use: No  . Sexual activity: No   Other Topics Concern  . Not on file   Social History Narrative  . No narrative on file     Family History  Problem Relation Age of Onset  . Heart disease Mother   . Hypertension Mother   . Skin cancer Father   . Diabetes Sister   . Hypertension Sister   . Diabetes Brother      Review of Systems: Review of Systems  Constitutional: Positive for malaise/fatigue. Negative for chills, diaphoresis, fever and weight loss.  HENT: Negative for congestion.   Eyes: Negative for discharge and redness.  Respiratory: Negative for cough, hemoptysis, sputum production, shortness of breath and wheezing.   Cardiovascular: Negative for chest pain, palpitations, orthopnea, claudication, leg swelling and PND.  Gastrointestinal: Negative for abdominal pain, blood in stool, heartburn, melena, nausea and vomiting.  Genitourinary: Negative for hematuria.  Musculoskeletal: Negative for falls and myalgias.  Skin: Negative for rash.  Neurological: Positive for weakness. Negative for dizziness, tingling, tremors, sensory change, speech change, focal weakness and loss of consciousness.  Endo/Heme/Allergies: Does not bruise/bleed easily.  Psychiatric/Behavioral: Negative for substance abuse. The patient is not nervous/anxious.   All other systems  reviewed and are negative.   Labs:  Recent Labs  07/05/17 1732 07/06/17 0729  TROPONINI 0.04* <0.03   Lab Results  Component Value Date   WBC 6.3 07/06/2017   HGB 11.1 (L) 07/06/2017   HCT 33.8 (L) 07/06/2017   MCV 86.7 07/06/2017   PLT 141 (L) 07/06/2017     Recent Labs Lab 07/06/17 0418  NA 140  K 4.0  CL 109  CO2 24  BUN 8  CREATININE 0.70  CALCIUM 8.1*  GLUCOSE 123*   No results found for: CHOL, HDL, LDLCALC, TRIG No results found for: DDIMER  Radiology/Studies:  Ct Angio Chest Pe W Or Wo Contrast  Result Date: 07/05/2017 IMPRESSION: 1. No pulmonary embolus. 2. No other acute thoracic abnormality. 3. Hepatic metastases demonstrated on prior PET CT are not visible on this study. Electronically Signed   By: Ulyses Jarred M.D.   On: 07/05/2017 22:26    EKG: Interpreted by me showed: sinus bradycardia, 52 bpm, left axis deviation, no acute st/t changes Telemetry: Interpreted by me showed: not on telemetry   Weights: [employer]  07/05/17 1603  Weight: 130 lb 11.2 oz (59.3 kg)     Physical Exam: Blood pressure 102/63, pulse 70, temperature 98.5 F (36.9 C), resp. rate 20, height 4\' 11"  (1.499 m), weight 130 lb 11.2 oz (59.3 kg), SpO2 96 %. Body mass index is 26.4 kg/m. General: Well developed, well nourished, in no acute distress. Head: Normocephalic, atraumatic, sclera non-icteric, no xanthomas, nares are without discharge.  Neck: Negative for carotid bruits. JVD not elevated. Lungs: Clear bilaterally to auscultation without wheezes, rales, or rhonchi. Breathing is unlabored. Heart: RRR with S1 S2. No murmurs, rubs, or gallops appreciated. Abdomen: Soft, non-tender, non-distended with normoactive bowel sounds. No hepatomegaly. No rebound/guarding. No obvious abdominal masses. Msk:  Strength and tone appear normal for age. Extremities: No clubbing or cyanosis. No edema. Distal pedal pulses are 2+ and equal bilaterally. Neuro: Alert and oriented X 3. No  facial asymmetry. No focal deficit. Moves all extremities spontaneously. Psych:  Responds to questions appropriately with a normal affect.    Assessment and Plan:  Principal Problem:   Postural dizziness with presyncope Active Problems:   Carcinoid tumor   Elevated troponin    1. Elevated troponin: -Never with chest pain -Minimally elevated with a peak of 0.09, now down trending -Repeat level this morning negative -Not consistent with ACS -Will stop heparin gtt -Check TTE, if normal, no further cardiac workup is advised  2. Presyncope/hypotension: -Possibly related to dehydration/poor PO intake -Check TTE as above -Consider telemetry given reported tachycardic rates  3. Carcinoid tumor: -Per IM/oncology    Signed, Christell Faith, PA-C [place] Pager: [phone] 07/06/2017, 8:38 AM

## 2017-07-06 NOTE — Progress Notes (Signed)
Initial Nutrition Assessment  DOCUMENTATION CODES:   Non-severe (moderate) malnutrition in context of chronic illness  INTERVENTION:   Ensure Enlive po BID, each supplement provides 350 kcal and 20 grams of protein  MVI  NUTRITION DIAGNOSIS:   Malnutrition (moderate) related to cancer and cancer related treatments as evidenced by energy intake < or equal to 75% for > or equal to 1 month, 7 percent weight loss in 3 months.  GOAL:   Patient will meet greater than or equal to 90% of their needs  MONITOR:   PO intake, Supplement acceptance, Labs, Weight trends  REASON FOR ASSESSMENT:   Malnutrition Screening Tool    ASSESSMENT:   71 y.o. female with a h/o metastatic carcinoid tumor of the stomach followed by Dr. Grayland Ormond, HLD, hypothyroidism, liver cysts, GERD and depression who was transferred from [facility] for weakness and elevated troponin.   Met with pt in room today. Pt reports poor appetite and oral intake since April when she started cancer treatments. Per chart, pt has lost 9lbs(7%) in 3 months; this is significant given history. Pt reports intermittent nausea, clamminess, and flushing of her skin after eating. Pt documented to have eaten 75% of her dinner last night. Pt's breakfast tray was on pt's side table at time of RD visit today which was <25% eaten. RD discussed with pt the importance of adequate protein intake to preserve lean muscle mass and prevent falls. Pt would like to have Ensure; RD will order.   Medications reviewed and include: synthroid  Labs reviewed: Ca 8.1(L)  Nutrition-Focused physical exam completed. Findings are no fat depletion, no muscle depletion, and no edema.   Diet Order:  Diet Heart Room service appropriate? Yes; Fluid consistency: Thin  Skin:  Reviewed, no issues  Last BM:  7/19  Height:   Ht Readings from Last 1 Encounters:  07/05/17 _0  (1.499 m)    Weight:   Wt Readings from Last 1 Encounters:  07/05/17 130 lb  11.2 oz (59.3 kg)    Ideal Body Weight:  44.6 kg  BMI:  Body mass index is 26.4 kg/m.  Estimated Nutritional Needs:   Kcal:  1650-1950kcal/day   Protein:  76-88g/day   Fluid:  >1.6L/day   EDUCATION NEEDS:   Education needs addressed  Koleen Distance MS, RD, LDN Pager #[phone] After Hours Pager: [phone]

## 2017-07-06 NOTE — Progress Notes (Signed)
ANTICOAGULATION CONSULT NOTE - Initial Consult  Pharmacy Consult for heparin Indication: VTE treatment  Allergies  Allergen Reactions  . Codeine Nausea Only    Patient Measurements: Height: 4\' 11"  (149.9 cm) Weight: 130 lb 11.2 oz (59.3 kg) IBW/kg (Calculated) : 43.2 Heparin Dosing Weight: 55.6 kg  Vital Signs: Temp: 98.5 F (36.9 C) (07/20 0428) BP: 102/63 (07/20 0428) Pulse Rate: 70 (07/20 0428)  Labs:  Recent Labs  07/05/17 1732 07/05/17 2028 07/06/17 0418  HGB  --   --  11.1*  HCT  --   --  33.8*  PLT  --   --  141*  APTT  --  26  --   LABPROT  --  15.7*  --   INR  --  1.24  --   HEPARINUNFRC  --   --  1.00*  CREATININE 0.73  --  0.70  TROPONINI 0.04*  --   --     Estimated Creatinine Clearance: 52 mL/min (by C-G formula based on SCr of 0.7 mg/dL).   Medical History: Past Medical History:  Diagnosis Date  . Anemia   . Depression   . GERD (gastroesophageal reflux disease)   . Hyperlipidemia   . Hypothyroidism   . Liver cyst   . Malignant carcinoid tumor of stomach ([facility])     Medications:  Infusions:  . sodium chloride    . sodium chloride 75 mL/hr at 07/06/17 0425  . heparin      Assessment: 70 yof with elevated serum fibrin derivatives. Pharmacy consulted to dose heparin for VTE. No PTA OAC noted.   Goal of Therapy:  Heparin level 0.3-0.7 units/ml Monitor platelets by anticoagulation protocol: Yes   Plan:  Give 3500 units bolus x 1 Start heparin infusion at 950 units/hr Check anti-Xa level in 6 hours and daily while on heparin Continue to monitor H&H and platelets   7/20 @ 0418 HL 1.00 supratherapeutic. Will hold heparin drip for 1 hour and will restart rate at 800 units/hr and will recheck HL @ 1200.  Tobie Lords, Pharm.D., BCPS Clinical Pharmacist 07/06/2017,5:30 AM

## 2017-07-08 ENCOUNTER — Inpatient Hospital Stay
Admission: EM | Admit: 2017-07-08 | Discharge: 2017-07-18 | DRG: 242 | Disposition: E | Payer: Medicare Other | Attending: Internal Medicine | Admitting: Internal Medicine

## 2017-07-08 DIAGNOSIS — Z950 Presence of cardiac pacemaker: Secondary | ICD-10-CM | POA: Diagnosis not present

## 2017-07-08 DIAGNOSIS — E876 Hypokalemia: Secondary | ICD-10-CM | POA: Diagnosis present

## 2017-07-08 DIAGNOSIS — F329 Major depressive disorder, single episode, unspecified: Secondary | ICD-10-CM | POA: Diagnosis present

## 2017-07-08 DIAGNOSIS — E785 Hyperlipidemia, unspecified: Secondary | ICD-10-CM | POA: Diagnosis present

## 2017-07-08 DIAGNOSIS — E870 Hyperosmolality and hypernatremia: Secondary | ICD-10-CM | POA: Diagnosis not present

## 2017-07-08 DIAGNOSIS — Z7951 Long term (current) use of inhaled steroids: Secondary | ICD-10-CM | POA: Diagnosis not present

## 2017-07-08 DIAGNOSIS — Z90710 Acquired absence of both cervix and uterus: Secondary | ICD-10-CM

## 2017-07-08 DIAGNOSIS — Z66 Do not resuscitate: Secondary | ICD-10-CM | POA: Diagnosis not present

## 2017-07-08 DIAGNOSIS — I495 Sick sinus syndrome: Secondary | ICD-10-CM

## 2017-07-08 DIAGNOSIS — I469 Cardiac arrest, cause unspecified: Secondary | ICD-10-CM | POA: Diagnosis not present

## 2017-07-08 DIAGNOSIS — G931 Anoxic brain damage, not elsewhere classified: Secondary | ICD-10-CM | POA: Diagnosis present

## 2017-07-08 DIAGNOSIS — C787 Secondary malignant neoplasm of liver and intrahepatic bile duct: Secondary | ICD-10-CM | POA: Diagnosis present

## 2017-07-08 DIAGNOSIS — R570 Cardiogenic shock: Secondary | ICD-10-CM | POA: Diagnosis present

## 2017-07-08 DIAGNOSIS — J9691 Respiratory failure, unspecified with hypoxia: Secondary | ICD-10-CM | POA: Diagnosis not present

## 2017-07-08 DIAGNOSIS — I97120 Postprocedural cardiac arrest following cardiac surgery: Secondary | ICD-10-CM | POA: Diagnosis not present

## 2017-07-08 DIAGNOSIS — Z452 Encounter for adjustment and management of vascular access device: Secondary | ICD-10-CM | POA: Diagnosis not present

## 2017-07-08 DIAGNOSIS — C7A092 Malignant carcinoid tumor of the stomach: Secondary | ICD-10-CM | POA: Diagnosis present

## 2017-07-08 DIAGNOSIS — E039 Hypothyroidism, unspecified: Secondary | ICD-10-CM | POA: Diagnosis present

## 2017-07-08 DIAGNOSIS — I951 Orthostatic hypotension: Secondary | ICD-10-CM | POA: Diagnosis present

## 2017-07-08 DIAGNOSIS — Z885 Allergy status to narcotic agent status: Secondary | ICD-10-CM

## 2017-07-08 DIAGNOSIS — Z833 Family history of diabetes mellitus: Secondary | ICD-10-CM | POA: Diagnosis not present

## 2017-07-08 DIAGNOSIS — R7989 Other specified abnormal findings of blood chemistry: Secondary | ICD-10-CM | POA: Diagnosis present

## 2017-07-08 DIAGNOSIS — Z515 Encounter for palliative care: Secondary | ICD-10-CM

## 2017-07-08 DIAGNOSIS — I639 Cerebral infarction, unspecified: Secondary | ICD-10-CM

## 2017-07-08 DIAGNOSIS — K219 Gastro-esophageal reflux disease without esophagitis: Secondary | ICD-10-CM | POA: Diagnosis present

## 2017-07-08 DIAGNOSIS — Z808 Family history of malignant neoplasm of other organs or systems: Secondary | ICD-10-CM | POA: Diagnosis not present

## 2017-07-08 DIAGNOSIS — J9690 Respiratory failure, unspecified, unspecified whether with hypoxia or hypercapnia: Secondary | ICD-10-CM

## 2017-07-08 DIAGNOSIS — I5021 Acute systolic (congestive) heart failure: Secondary | ICD-10-CM | POA: Diagnosis not present

## 2017-07-08 DIAGNOSIS — I442 Atrioventricular block, complete: Principal | ICD-10-CM | POA: Diagnosis present

## 2017-07-08 DIAGNOSIS — I248 Other forms of acute ischemic heart disease: Secondary | ICD-10-CM | POA: Diagnosis present

## 2017-07-08 DIAGNOSIS — J9601 Acute respiratory failure with hypoxia: Secondary | ICD-10-CM | POA: Diagnosis not present

## 2017-07-08 DIAGNOSIS — R55 Syncope and collapse: Secondary | ICD-10-CM | POA: Diagnosis present

## 2017-07-08 DIAGNOSIS — I5023 Acute on chronic systolic (congestive) heart failure: Secondary | ICD-10-CM | POA: Diagnosis present

## 2017-07-08 DIAGNOSIS — I455 Other specified heart block: Secondary | ICD-10-CM

## 2017-07-08 DIAGNOSIS — R748 Abnormal levels of other serum enzymes: Secondary | ICD-10-CM | POA: Diagnosis not present

## 2017-07-08 DIAGNOSIS — D3A00 Benign carcinoid tumor of unspecified site: Secondary | ICD-10-CM | POA: Diagnosis present

## 2017-07-08 DIAGNOSIS — I429 Cardiomyopathy, unspecified: Secondary | ICD-10-CM | POA: Diagnosis present

## 2017-07-08 DIAGNOSIS — R042 Hemoptysis: Secondary | ICD-10-CM | POA: Diagnosis present

## 2017-07-08 DIAGNOSIS — I493 Ventricular premature depolarization: Secondary | ICD-10-CM | POA: Diagnosis present

## 2017-07-08 DIAGNOSIS — Z85020 Personal history of malignant carcinoid tumor of stomach: Secondary | ICD-10-CM | POA: Diagnosis not present

## 2017-07-08 DIAGNOSIS — Z79899 Other long term (current) drug therapy: Secondary | ICD-10-CM | POA: Diagnosis not present

## 2017-07-08 DIAGNOSIS — Z8249 Family history of ischemic heart disease and other diseases of the circulatory system: Secondary | ICD-10-CM | POA: Diagnosis not present

## 2017-07-08 DIAGNOSIS — I340 Nonrheumatic mitral (valve) insufficiency: Secondary | ICD-10-CM | POA: Diagnosis present

## 2017-07-08 DIAGNOSIS — R609 Edema, unspecified: Secondary | ICD-10-CM

## 2017-07-08 DIAGNOSIS — R778 Other specified abnormalities of plasma proteins: Secondary | ICD-10-CM | POA: Diagnosis present

## 2017-07-08 DIAGNOSIS — Z4659 Encounter for fitting and adjustment of other gastrointestinal appliance and device: Secondary | ICD-10-CM

## 2017-07-08 DIAGNOSIS — I4901 Ventricular fibrillation: Secondary | ICD-10-CM | POA: Diagnosis not present

## 2017-07-08 DIAGNOSIS — R0602 Shortness of breath: Secondary | ICD-10-CM

## 2017-07-08 HISTORY — DX: Syncope and collapse: R55

## 2017-07-08 HISTORY — DX: Pulmonary hypertension, unspecified: I27.20

## 2017-07-08 LAB — COMPREHENSIVE METABOLIC PANEL
ALBUMIN: 3.1 g/dL — AB (ref 3.5–5.0)
ALK PHOS: 205 U/L — AB (ref 38–126)
ALT: 82 U/L — AB (ref 14–54)
ANION GAP: 7 (ref 5–15)
AST: 96 U/L — AB (ref 15–41)
BUN: 8 mg/dL (ref 6–20)
CALCIUM: 7.9 mg/dL — AB (ref 8.9–10.3)
CO2: 24 mmol/L (ref 22–32)
CREATININE: 0.84 mg/dL (ref 0.44–1.00)
Chloride: 110 mmol/L (ref 101–111)
GFR calc Af Amer: >60 mL/min (ref >60)
GFR calc non Af Amer: >60 mL/min (ref >60)
GLUCOSE: 170 mg/dL — AB (ref 65–99)
Potassium: 3.3 mmol/L — ABNORMAL LOW (ref 3.5–5.1)
SODIUM: 141 mmol/L (ref 135–145)
Total Bilirubin: 1 mg/dL (ref 0.3–1.2)
Total Protein: 5.2 g/dL — ABNORMAL LOW (ref 6.5–8.1)

## 2017-07-08 LAB — URINALYSIS, COMPLETE (UACMP) WITH MICROSCOPIC
Bilirubin Urine: NEGATIVE
Glucose, UA: NEGATIVE mg/dL
Ketones, ur: NEGATIVE mg/dL
Leukocytes, UA: NEGATIVE
NITRITE: NEGATIVE
PH: 6 (ref 5.0–8.0)
Protein, ur: 100 mg/dL — AB
SPECIFIC GRAVITY, URINE: 1.01 (ref 1.005–1.030)

## 2017-07-08 LAB — CBC
HCT: 34.2 % — ABNORMAL LOW (ref 35.0–47.0)
Hemoglobin: 11.5 g/dL — ABNORMAL LOW (ref 12.0–16.0)
MCH: 29.4 pg (ref 26.0–34.0)
MCHC: 33.5 g/dL (ref 32.0–36.0)
MCV: 87.8 fL (ref 80.0–100.0)
PLATELETS: 144 10*3/uL — AB (ref 150–440)
RBC: 3.9 MIL/uL (ref 3.80–5.20)
RDW: 15.3 % — ABNORMAL HIGH (ref 11.5–14.5)
WBC: 9.9 10*3/uL (ref 3.6–11.0)

## 2017-07-08 LAB — TROPONIN I
TROPONIN I: 0.04 ng/mL — AB (ref <0.03)
TROPONIN I: 0.03 ng/mL — AB (ref <0.03)
Troponin I: 0.05 ng/mL (ref <0.03)

## 2017-07-08 LAB — GLUCOSE, CAPILLARY: Glucose-Capillary: 134 mg/dL — ABNORMAL HIGH (ref 65–99)

## 2017-07-08 MED ORDER — ACETAMINOPHEN 325 MG PO TABS: 650.0000 mg | ORAL_TABLET | Freq: Four times a day (QID) | ORAL | Status: DC | PRN

## 2017-07-08 MED ORDER — ACETAMINOPHEN 650 MG RE SUPP: 650.0000 mg | Freq: Four times a day (QID) | RECTAL | Status: DC | PRN

## 2017-07-08 MED ORDER — ONDANSETRON HCL 4 MG/2ML IJ SOLN
4.0000 mg | Freq: Four times a day (QID) | INTRAMUSCULAR | Status: DC | PRN
  Administered 2017-07-10: 4 mg via INTRAVENOUS
  Filled 2017-07-08: qty 2

## 2017-07-08 MED ORDER — SODIUM CHLORIDE 0.9 % IV SOLN
1000.0000 mL | Freq: Once | INTRAVENOUS | Status: AC
  Administered 2017-07-08: 1000 mL via INTRAVENOUS

## 2017-07-08 MED ORDER — FLUOXETINE HCL 20 MG PO CAPS
20.0000 mg | ORAL_CAPSULE | Freq: Every day | ORAL | Status: DC
  Administered 2017-07-08 – 2017-07-10 (×2): 20 mg via ORAL
  Filled 2017-07-08 (×3): qty 1

## 2017-07-08 MED ORDER — DORZOLAMIDE HCL-TIMOLOL MAL 2-0.5 % OP SOLN
1.0000 [drp] | Freq: Two times a day (BID) | OPHTHALMIC | Status: DC
  Administered 2017-07-08 – 2017-07-14 (×12): 1 [drp] via OPHTHALMIC
  Filled 2017-07-08: qty 10

## 2017-07-08 MED ORDER — LEVOTHYROXINE SODIUM 50 MCG PO TABS
125.0000 ug | ORAL_TABLET | Freq: Every day | ORAL | Status: DC
  Administered 2017-07-10 – 2017-07-14 (×4): 125 ug via ORAL
  Filled 2017-07-08 (×5): qty 1

## 2017-07-08 MED ORDER — ATORVASTATIN CALCIUM 20 MG PO TABS
20.0000 mg | ORAL_TABLET | Freq: Every day | ORAL | Status: DC
  Administered 2017-07-08 – 2017-07-09 (×2): 20 mg via ORAL
  Filled 2017-07-08 (×2): qty 1

## 2017-07-08 MED ORDER — SENNOSIDES-DOCUSATE SODIUM 8.6-50 MG PO TABS: 1.0000 | ORAL_TABLET | Freq: Every evening | ORAL | Status: DC | PRN

## 2017-07-08 MED ORDER — LATANOPROST 0.005 % OP SOLN
1.0000 [drp] | Freq: Every day | OPHTHALMIC | Status: DC
  Administered 2017-07-08 – 2017-07-13 (×6): 1 [drp] via OPHTHALMIC
  Filled 2017-07-08 (×2): qty 2.5

## 2017-07-08 MED ORDER — ONDANSETRON HCL 4 MG PO TABS: 4.0000 mg | ORAL_TABLET | Freq: Four times a day (QID) | ORAL | Status: DC | PRN

## 2017-07-08 MED ORDER — ENOXAPARIN SODIUM 40 MG/0.4ML ~~LOC~~ SOLN
40.0000 mg | SUBCUTANEOUS | Status: DC
  Administered 2017-07-08: 40 mg via SUBCUTANEOUS
  Filled 2017-07-08: qty 0.4

## 2017-07-08 MED ORDER — ONDANSETRON HCL 4 MG/2ML IJ SOLN
4.0000 mg | Freq: Once | INTRAMUSCULAR | Status: AC
  Administered 2017-07-08: 4 mg via INTRAVENOUS
  Filled 2017-07-08: qty 2

## 2017-07-08 MED ORDER — ESTRADIOL 1 MG PO TABS
0.5000 mg | ORAL_TABLET | Freq: Every day | ORAL | Status: DC
  Administered 2017-07-10: 0.5 mg via ORAL
  Filled 2017-07-08 (×2): qty 0.5

## 2017-07-08 MED ORDER — HYDROCODONE-ACETAMINOPHEN 5-325 MG PO TABS: 1.0000 | ORAL_TABLET | ORAL | Status: DC | PRN

## 2017-07-08 MED ORDER — POTASSIUM CHLORIDE IN NACL 20-0.9 MEQ/L-% IV SOLN
INTRAVENOUS | Status: DC
  Administered 2017-07-08: 17:00:00 via INTRAVENOUS
  Filled 2017-07-08 (×4): qty 1000

## 2017-07-08 MED ORDER — DOXYCYCLINE HYCLATE 100 MG PO TABS
100.0000 mg | ORAL_TABLET | Freq: Two times a day (BID) | ORAL | Status: DC
  Administered 2017-07-08: 21:00:00 100 mg via ORAL
  Filled 2017-07-08 (×2): qty 1

## 2017-07-08 NOTE — Progress Notes (Signed)
Pt armbands yellow(fall) and red(allergies) placed upon pt's wrist on admission

## 2017-07-08 NOTE — ED Provider Notes (Signed)
[facility] Emergency Department Provider Note   ____________________________________________    I have reviewed the triage vital signs and the nursing notes.   HISTORY  Chief Complaint Loss of Consciousness     HPI Tanya Wells is a 71 y.o. female who presents after single episode. Patient apparently got up this morning got some coffee and came back to bed she was sitting on the edge of the bed when apparently she syncopized per husband. Currently she feels well. She complains primarily of feeling weak all over. Recent admission for similar situation. Patient is due for chemotherapy tomorrow. She feels mildly nauseous but no vomiting. No chest pain or shortness of breath. No fevers reported. Last chemotherapy was over a month ago. She is followed at [place].   Past Medical History:  Diagnosis Date  . Anemia   . Depression   . GERD (gastroesophageal reflux disease)   . Hyperlipidemia   . Hypothyroidism   . Liver cyst   . Malignant carcinoid tumor of stomach [facility])     Patient Active Problem List   Diagnosis Date Noted  . Postural dizziness with presyncope 07/06/2017  . Carcinoid tumor 07/06/2017  . Elevated troponin 07/06/2017  . Malnutrition of moderate degree 07/06/2017  . Malignant carcinoid tumor of stomach ([place]) 03/25/2017    Past Surgical History:  Procedure Laterality Date  . ABDOMINAL HYSTERECTOMY      Prior to Admission medications   Medication Sig Start Date End Date Taking? Authorizing Provider  atorvastatin (LIPITOR) 20 MG tablet Take 20 mg by mouth daily at 6 PM.  10/17/16 10/17/17 Yes [provider]  dorzolamide-timolol (COSOPT) 22.3-6.8 MG/ML ophthalmic solution Administer 1 drop to both eyes Two (2) times a day. 02/11/14  Yes [provider]  doxycycline (VIBRA-TABS) 100 MG tablet Take 100 mg by mouth 2 (two) times daily. 07/04/17 07-18-2017 Yes [provider]  estradiol (ESTRACE) 0.5 MG  tablet TAKE 1 TABLET EVERY DAY 11/07/16  Yes [provider]  FLUoxetine (PROZAC) 20 MG capsule Take 20 mg by mouth daily.  10/12/16  Yes [provider]  fluticasone (FLONASE) 50 MCG/ACT nasal spray SPRAY 2 SPRAYS IN EACH NOSTRIL DAILY 03/02/17  Yes [provider]  latanoprost (XALATAN) 0.005 % ophthalmic solution INSTILL ONE DROP TO BOTH EYES EVERY NIGHT 09/28/15  Yes [provider]  levothyroxine (SYNTHROID, LEVOTHROID) 125 MCG tablet Take 125 mcg by mouth daily before breakfast.  01/18/17  Yes [provider]     Allergies Codeine  Family History  Problem Relation Age of Onset  . Heart disease Mother   . Hypertension Mother   . Skin cancer Father   . Diabetes Sister   . Hypertension Sister   . Diabetes Brother     Social History Social History  Substance Use Topics  . Smoking status: Never Smoker  . Smokeless tobacco: Never Used  . Alcohol use No    Review of Systems  Constitutional: No fever/chills Eyes: No visual changes.  ENT: No Neck pain Cardiovascular: Denies chest pain. Respiratory: Denies shortness of breath. Gastrointestinal: No abdominal pain.  No Vomiting  Genitourinary: Negative for dysuria. Musculoskeletal: Negative for back pain. Skin: Negative for rash. Neurological: Negative for headaches   ____________________________________________   PHYSICAL EXAM:  VITAL SIGNS: ED Triage Vitals  Enc Vitals Group     BP 06/17/2017 1032 107/64     Pulse Rate 06/19/2017 1032 (!) 56     Resp 07/11/2017 1032 11  Temp 07/17/2017 1032 (!) 97.4 F (36.3 C)     Temp Source 06/23/2017 1032 Oral     SpO2 07/04/2017 1032 98 %     Weight 06/24/2017 1026 57.6 kg (127 lb)     Height 07/03/2017 1026 1.499 m (4\' 11" )     Head Circumference --      Peak Flow --      Pain Score --      Pain Loc --      Pain Edu? --      Excl. in [place]? --     Constitutional: Alert and oriented. No acute distress. Pleasant and interactive Eyes:  Conjunctivae are normal.  Head: Atraumatic. Nose: No congestion/rhinnorhea. Mouth/Throat: Mucous membranes are moist.   Neck:  Painless ROM Cardiovascular:Mild bradycardia, regular rhythm. Grossly normal heart sounds.  Good peripheral circulation. Respiratory: Normal respiratory effort.  No retractions. Lungs CTAB. Gastrointestinal: Soft and nontender. No distention.  No CVA tenderness. Genitourinary: deferred Musculoskeletal: No lower extremity tenderness nor edema.  Warm and well perfused Neurologic:  Normal speech and language. No gross focal neurologic deficits are appreciated.  Skin:  Skin is warm, dry and intact. No rash noted. Psychiatric: Mood and affect are normal. Speech and behavior are normal.  ____________________________________________   LABS (all labs ordered are listed, but only abnormal results are displayed)  Labs Reviewed  CBC - Abnormal; Notable for the following:       Result Value   Hemoglobin 11.5 (*)    HCT 34.2 (*)    RDW 15.3 (*)    Platelets 144 (*)    All other components within normal limits  URINALYSIS, COMPLETE (UACMP) WITH MICROSCOPIC - Abnormal; Notable for the following:    Color, Urine YELLOW (*)    APPearance HAZY (*)    Hgb urine dipstick MODERATE (*)    Protein, ur 100 (*)    Bacteria, UA RARE (*)    Squamous Epithelial / LPF 6-30 (*)    All other components within normal limits  TROPONIN I - Abnormal; Notable for the following:    Troponin I 0.05 (*)    All other components within normal limits  COMPREHENSIVE METABOLIC PANEL - Abnormal; Notable for the following:    Potassium 3.3 (*)    Glucose, Bld 170 (*)    Calcium 7.9 (*)    Total Protein 5.2 (*)    Albumin 3.1 (*)    AST 96 (*)    ALT 82 (*)    Alkaline Phosphatase 205 (*)    All other components within normal limits  GLUCOSE, CAPILLARY - Abnormal; Notable for the following:    Glucose-Capillary 134 (*)    All other components within normal limits  CBG MONITORING, ED    ____________________________________________  EKG  ED ECG REPORT I, Lavonia Drafts, the attending physician, personally viewed and interpreted this ECG.  Date: 06/19/2017  Rate: 50 Rhythm: normal sinus rhythm QRS Axis: normal Intervals: normal ST/T Wave abnormalities: normal Narrative Interpretation: Bradycardia  ____________________________________________  RADIOLOGY  None ____________________________________________   PROCEDURES  Procedure(s) performed: No    Critical Care performed: No ____________________________________________   INITIAL IMPRESSION / ASSESSMENT AND PLAN / ED COURSE  Pertinent labs & imaging results that were available during my care of the patient were reviewed by me and considered in my medical decision making (see chart for details).  Patient given IV fluids, labs overall unremarkable. Chronically elevated troponin. Orthostatics are positive, discuss with hospitalist for admission given that she is unsafe for  discharge at this time    ____________________________________________   FINAL CLINICAL IMPRESSION(S) / ED DIAGNOSES  Final diagnoses:  Syncope and collapse      NEW MEDICATIONS STARTED DURING THIS VISIT:  New Prescriptions   No medications on file     Note:  This document was prepared using Dragon voice recognition software and may include unintentional dictation errors.    Lavonia Drafts, MD 06/28/2017 [phone]

## 2017-07-08 NOTE — Progress Notes (Signed)
Pt has on red and yellow arm bands

## 2017-07-08 NOTE — ED Notes (Signed)
Hemoccult POSITIVE. EDP K[facility] notified.

## 2017-07-08 NOTE — ED Triage Notes (Addendum)
Per EMS pt was at home and husband called out due to pt passing out in the bed.  Pt states she has passed out 4 times in the last 6 months.  Pt denies any pain but states she is weak.  Pt is A&Ox4.  Pt was given Zofran in route along with 500 bolus of NS.  Pt also has a history of liver cancer and is due for chemo tomorrow.  Per EMS pt had tarry stools two weeks ago.

## 2017-07-08 NOTE — Progress Notes (Deleted)
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  INTERVAL HISTORY: Patient returns to clinic today for further evaluation and continuation of monthly Lanreotide. Since initiating treatments, her flushing, diaphoresis, and diarrhea have significantly improved but are not yet resolved. She has no neurologic complaints. She denies any fevers. She denies any nausea, but does have occasional vomiting after she eats. She does not complain of pain. She has no chest pain or shortness of breath. She has no urinary complaints. Patient otherwise feels well and offers no further specific complaints.  REVIEW OF SYSTEMS:   Review of Systems  Constitutional: Positive for diaphoresis. Negative for chills, fever, malaise/fatigue and weight loss.  Respiratory: Negative.  Negative for cough and shortness of breath.   Cardiovascular: Negative.  Negative for chest pain and leg swelling.  Gastrointestinal: Positive for vomiting. Negative for abdominal pain, constipation, diarrhea and nausea.  Genitourinary: Negative.   Musculoskeletal: Negative.   Neurological: Positive for tingling and sensory change. Negative for weakness.  Psychiatric/Behavioral: The patient is nervous/anxious.     As per HPI. Otherwise, a complete review of systems is negative.  PAST MEDICAL HISTORY: Past Medical History:  Diagnosis Date  . Anemia   . Depression   . GERD (gastroesophageal reflux disease)   . Hyperlipidemia   . Hypothyroidism   . Liver cyst   . Malignant carcinoid tumor of stomach ([place])     PAST SURGICAL HISTORY: Past Surgical History:  Procedure Laterality Date  . ABDOMINAL HYSTERECTOMY      FAMILY  HISTORY: Family History  Problem Relation Age of Onset  . Heart disease Mother   . Hypertension Mother   . Skin cancer Father   . Diabetes Sister   . Hypertension Sister   . Diabetes Brother     ADVANCED DIRECTIVES (Y/N):  N  HEALTH MAINTENANCE: Social History  Substance Use Topics  . Smoking status: Never Smoker  . Smokeless tobacco: Never Used  . Alcohol use No     Colonoscopy:  PAP:  Bone density:  Lipid panel:  Allergies  Allergen Reactions  . Codeine Nausea Only    No current facility-administered medications for this visit.    No current outpatient prescriptions on file.   Facility-Administered Medications Ordered in Other Visits  Medication Dose Route Frequency Provider Last Rate Last Dose  . 0.9 % NaCl with KCl 20 mEq/ L  infusion   Intravenous Continuous Bettey Costa, MD 100 mL/hr at 07/17/2017 1703    . acetaminophen (TYLENOL) tablet 650 mg  650 mg Oral Q6H PRN Bettey Costa, MD       Or  . acetaminophen (TYLENOL) suppository 650 mg  650 mg Rectal Q6H PRN Mody, Sital, MD      . atorvastatin (LIPITOR) tablet 20 mg  20 mg Oral q1800 Mody, Sital, MD   20 mg at 07/05/2017 1830  . dorzolamide-timolol (COSOPT) 22.3-6.8 MG/ML ophthalmic solution 1 drop  1 drop Both Eyes BID Bettey Costa, MD   1 drop at 06/27/2017 2106  . doxycycline (VIBRA-TABS) tablet 100 mg  100 mg Oral BID Bettey Costa, MD   100 mg at 07/12/2017 2106  . enoxaparin (LOVENOX) injection 40 mg  40 mg Subcutaneous Q24H Bettey Costa, MD   40 mg  at 06/18/2017 2106  . [START ON 07/09/2017] estradiol (ESTRACE) tablet 0.5 mg  0.5 mg Oral Daily Mody, Sital, MD      . FLUoxetine (PROZAC) capsule 20 mg  20 mg Oral Daily Benjie Karvonen, Sital, MD   20 mg at 06/30/2017 1830  . HYDROcodone-acetaminophen (NORCO/VICODIN) 5-325 MG per tablet 1-2 tablet  1-2 tablet Oral Q4H PRN Mody, Sital, MD      . latanoprost (XALATAN) 0.005 % ophthalmic solution 1 drop  1 drop Both Eyes QHS Bettey Costa, MD   1 drop at 07/17/2017 2106  . [START ON 07/09/2017]  levothyroxine (SYNTHROID, LEVOTHROID) tablet 125 mcg  125 mcg Oral QAC breakfast Mody, Sital, MD      . ondansetron (ZOFRAN) tablet 4 mg  4 mg Oral Q6H PRN Mody, Sital, MD       Or  . ondansetron (ZOFRAN) injection 4 mg  4 mg Intravenous Q6H PRN Mody, Sital, MD      . senna-docusate (Senokot-S) tablet 1 tablet  1 tablet Oral QHS PRN Bettey Costa, MD        OBJECTIVE: There were no vitals filed for this visit.   There is no height or weight on file to calculate BMI.    ECOG FS:0 - Asymptomatic  General: Well-developed, well-nourished, no acute distress. Eyes: Pink conjunctiva, anicteric sclera. HEENT: Normocephalic, moist mucous membranes, clear oropharnyx. Lungs: Clear to auscultation bilaterally. Heart: Regular rate and rhythm. No rubs, murmurs, or gallops. Abdomen: Soft, nontender, nondistended. No organomegaly noted, normoactive bowel sounds. Musculoskeletal: No edema, cyanosis, or clubbing. Neuro: Alert, answering all questions appropriately. Cranial nerves grossly intact. Skin: No rashes or petechiae noted. Psych: Normal affect.   LAB RESULTS:  Lab Results  Component Value Date   NA 141 07/06/2017   K 3.3 (L) 06/19/2017   CL 110 07/10/2017   CO2 24 06/20/2017   GLUCOSE 170 (H) 07/07/2017   BUN 8 06/22/2017   CREATININE 0.84 06/20/2017   CALCIUM 7.9 (L) 07/06/2017   PROT 5.2 (L) 06/25/2017   ALBUMIN 3.1 (L) 07/17/2017   AST 96 (H) 06/25/2017   ALT 82 (H) 06/21/2017   ALKPHOS 205 (H) 07/13/2017   BILITOT 1.0 07/16/2017   GFRNONAA >60 07/11/2017   GFRAA >60 07/07/2017    Lab Results  Component Value Date   WBC 9.9 07/17/2017   NEUTROABS 4.9 06/07/2017   HGB 11.5 (L) 07/11/2017   HCT 34.2 (L) 06/26/2017   MCV 87.8 07/01/2017   PLT 144 (L) 07/13/2017     STUDIES: Ct Angio Chest Pe W Or Wo Contrast  Result Date: 07/05/2017 CLINICAL DATA:  Dyspnea and tachycardia. Elevated white blood cell count. History of gastric carcinoid tumor. EXAM: CT ANGIOGRAPHY CHEST  WITH CONTRAST TECHNIQUE: Multidetector CT imaging of the chest was performed using the standard protocol during bolus administration of intravenous contrast. Multiplanar CT image reconstructions and MIPs were obtained to evaluate the vascular anatomy. CONTRAST:  70 mL Isovue 370 IV COMPARISON:  1. Chest radiograph 07/05/2017 2. PET CT 04/03/2017 3. CT chest 03/07/2017 FINDINGS: Cardiovascular: Contrast injection is sufficient to demonstrate satisfactory opacification of the pulmonary arteries to the segmental level. There is no pulmonary embolus. The main pulmonary artery is within normal limits for size. There is no CT evidence of acute right heart strain. The visualized aorta is normal. There is a normal 3-vessel arch branching pattern. Heart size is normal, without pericardial effusion. There is atherosclerotic calcification in the coronary arteries. Mediastinum/Nodes: No mediastinal, hilar or axillary lymphadenopathy. The visualized thyroid  and thoracic esophageal course are unremarkable. Lungs/Pleura: No pulmonary nodules or masses. No pleural effusion or pneumothorax. No focal airspace consolidation. No focal pleural abnormality. Upper Abdomen: Contrast bolus timing is not optimized for evaluation of the abdominal organs. Known hepatic metastases are not well characterized on this study. Musculoskeletal: No chest wall abnormality. No acute or significant osseous findings. Review of the MIP images confirms the above findings. IMPRESSION: 1. No pulmonary embolus. 2. No other acute thoracic abnormality. 3. Hepatic metastases demonstrated on prior PET CT are not visible on this study. Electronically Signed   By: Ulyses Jarred M.D.   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  PLAN:    1. 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 PET scan results reviewed  independently and reported as above. Her 24-hour urine 5 HIAA level was elevated at 9.6 (normal range less than 8.0). Chromogranin A levels are significantly elevated at 667.  For initial treatment given her symptoms of carcinoid syndrome as well as tumor control, will use 120mg  SQ Somatuline Depot every 28 days. Return to clinic in one and 2 months for lab and treatment and then in 3 months with repeat imaging, continuation of treatment, and further evaluation. If patient has progressive disease, can consider Afinitor 10 mg daily. 2. Vomiting with eating: Likely secondary to known gastric mass. Patient states she can tolerate a soft diet, therefore a referral was given to dietary for further evaluation and additional recommendations.  Approximately 30 minutes was spent in discussion of which greater than 50% was consultation.  Patient expressed understanding and was in agreement with this plan. She also understands that She can call clinic at any time with any questions, concerns, or complaints.    Lloyd Huger, MD   07/09/2017 10:02 PM

## 2017-07-08 NOTE — H&P (Signed)
[facility] at [facility] NAME: Tanya Wells    MR#:  [id number]  DATE OF BIRTH:  1946-04-02  DATE OF ADMISSION:  06/25/2017  PRIMARY CARE PHYSICIAN: Gennette Pac, NP   REQUESTING/REFERRING PHYSICIAN: dr Corky Downs  CHIEF COMPLAINT:   Syncope  HISTORY OF PRESENT ILLNESS:  Tanya Wells  is a 71 y.o. female with a known history of Malignant carcinoid tumor of the stomach he was discharged on July 20 for syncope and orthostatic hypotension who again presents with syncope due to orthostasis. Patient received IV fluids emergency room however remains symptomatic and significantly orthostatic.  During last hospital stay patient echocardiogram as well as cardiology evaluation due to her syncope. Syncope was felt to be due to dehydration/orthostasis and related to her underlying malignant carcinoid tumor She denies chest pain, diarrhea, shortness of breath, palpitations. She does have episodes of flushing.  PAST MEDICAL HISTORY:   Past Medical History:  Diagnosis Date  . Anemia   . Depression   . GERD (gastroesophageal reflux disease)   . Hyperlipidemia   . Hypothyroidism   . Liver cyst   . Malignant carcinoid tumor of stomach ([place])     PAST SURGICAL HISTORY:   Past Surgical History:  Procedure Laterality Date  . ABDOMINAL HYSTERECTOMY      SOCIAL HISTORY:   Social History  Substance Use Topics  . Smoking status: Never Smoker  . Smokeless tobacco: Never Used  . Alcohol use No    FAMILY HISTORY:   Family History  Problem Relation Age of Onset  . Heart disease Mother   . Hypertension Mother   . Skin cancer Father   . Diabetes Sister   . Hypertension Sister   . Diabetes Brother     DRUG ALLERGIES:   Allergies  Allergen Reactions  . Codeine Nausea Only    REVIEW OF SYSTEMS:   Review of Systems  Constitutional: Positive for malaise/fatigue. Negative for chills and fever.  HENT: Negative.  Negative for ear discharge, ear  pain, hearing loss, nosebleeds and sore throat.   Eyes: Negative.  Negative for blurred vision and pain.  Respiratory: Negative.  Negative for cough, hemoptysis, shortness of breath and wheezing.   Cardiovascular: Negative.  Negative for chest pain, palpitations and leg swelling.  Gastrointestinal: Negative for abdominal pain, blood in stool, diarrhea, nausea and vomiting.  Genitourinary: Negative.  Negative for dysuria.  Musculoskeletal: Negative.  Negative for back pain.  Skin: Negative.   Neurological: Positive for weakness. Negative for dizziness, tremors, speech change, focal weakness, seizures and headaches.  Endo/Heme/Allergies: Does not bruise/bleed easily.       Positive for flushing  Psychiatric/Behavioral: Negative.  Negative for depression, hallucinations and suicidal ideas.    MEDICATIONS AT HOME:   Prior to Admission medications   Medication Sig Start Date End Date Taking? Authorizing Provider  atorvastatin (LIPITOR) 20 MG tablet Take 20 mg by mouth daily at 6 PM.  10/17/16 10/17/17 Yes [provider]  dorzolamide-timolol (COSOPT) 22.3-6.8 MG/ML ophthalmic solution Administer 1 drop to both eyes Two (2) times a day. 02/11/14  Yes [provider]  doxycycline (VIBRA-TABS) 100 MG tablet Take 100 mg by mouth 2 (two) times daily. 07/04/17 07/17/2017 Yes [provider]  estradiol (ESTRACE) 0.5 MG tablet TAKE 1 TABLET EVERY DAY 11/07/16  Yes [provider]  FLUoxetine (PROZAC) 20 MG capsule Take 20 mg by mouth daily.  10/12/16  Yes [provider]  fluticasone (FLONASE) 50 MCG/ACT nasal spray  SPRAY 2 SPRAYS IN EACH NOSTRIL DAILY 03/02/17  Yes [provider]  latanoprost (XALATAN) 0.005 % ophthalmic solution INSTILL ONE DROP TO BOTH EYES EVERY NIGHT 09/28/15  Yes [provider]  levothyroxine (SYNTHROID, LEVOTHROID) 125 MCG tablet Take 125 mcg by mouth daily before breakfast.  01/18/17  Yes [provider]       VITAL SIGNS:  Blood pressure 118/70, pulse 80, temperature (!) 97.4 F (36.3 C), temperature source Oral, resp. rate 15, height 4\' 11"  (1.499 m), weight 57.6 kg (127 lb), SpO2 93 %.  PHYSICAL EXAMINATION:   Physical Exam  Constitutional: She is oriented to person, place, and time and well-developed, well-nourished, and in no distress. No distress.  HENT:  Head: Normocephalic.  Eyes: No scleral icterus.  Neck: Normal range of motion. Neck supple. No JVD present. No tracheal deviation present.  Cardiovascular: Normal rate, regular rhythm and normal heart sounds.  Exam reveals no gallop and no friction rub.   No murmur heard. Pulmonary/Chest: Effort normal and breath sounds normal. No respiratory distress. She has no wheezes. She has no rales. She exhibits no tenderness.  Abdominal: Soft. Bowel sounds are normal. She exhibits no distension and no mass. There is no tenderness. There is no rebound and no guarding.  Musculoskeletal: Normal range of motion. She exhibits no edema.  Neurological: She is alert and oriented to person, place, and time.  Skin: Skin is warm. No rash noted. No erythema.  Psychiatric: Affect and judgment normal.      LABORATORY PANEL:   CBC  Recent Labs Lab 07/11/2017 1028  WBC 9.9  HGB 11.5*  HCT 34.2*  PLT 144*   ------------------------------------------------------------------------------------------------------------------  Chemistries   Recent Labs Lab 06/25/2017 1036  NA 141  K 3.3*  CL 110  CO2 24  GLUCOSE 170*  BUN 8  CREATININE 0.84  CALCIUM 7.9*  AST 96*  ALT 82*  ALKPHOS 205*  BILITOT 1.0   ------------------------------------------------------------------------------------------------------------------  Cardiac Enzymes  Recent Labs Lab 06/22/2017 1036  TROPONINI 0.05*   ------------------------------------------------------------------------------------------------------------------  RADIOLOGY:  No results found.  EKG:    Sinus rhythm with interventricular conduction delay no ST elevation or depression  IMPRESSION AND PLAN:   71 year old female with malignant carcinoid tumor of the stomach on monthly lantreotide injections who presents due to syncope and found to have significant orthostatic hypotension.  1. Syncope with significant orthostasis: Continue IV fluids Oncology evaluation Patient had echocardiogram during last hospital stay was unremarkable. Continue orthostatic vital signs Continue telemetry Consider adding Florinef or Midodrine if orthostatic persists despite IV fluids.  2. Malignant carcinoid tumor of the stomach causing orthostasis and flushing Oncology evaluation  3. Elevated troponin due to demand ischemia: Patient had recent auscultation also with elevated troponins which were flat. She was evaluated by [facility] cardiology. Continue telemetry Follow serial enzymes  4.HLD: Continue statin  5. Hypothyroidism: Continue Synthroid  6. Depression: Continue Prozac    All the records are reviewed and case discussed with ED provider. Management plans discussed with the patient and she is in agreement  CODE STATUS: full  TOTAL TIME TAKING CARE OF THIS PATIENT: 45 minutes.    Frazier Balfour M.D on 07/07/2017 at 3:26 PM  Between 7am to 6pm - Pager - [phone]  After 6pm go to [email or web address] - Proofreader  [facility]  Office  [phone]  CC: Primary care physician; Gennette Pac, NP

## 2017-07-08 NOTE — Plan of Care (Signed)
Problem: Education: Goal: Knowledge of [facility] Education information/materials will improve Outcome: [facility] Patient Guide given to pt upon admission

## 2017-07-09 ENCOUNTER — Other Ambulatory Visit: Payer: Medicare Other

## 2017-07-09 ENCOUNTER — Inpatient Hospital Stay: Payer: Medicare Other

## 2017-07-09 ENCOUNTER — Inpatient Hospital Stay: Payer: Medicare Other | Admitting: Oncology

## 2017-07-09 ENCOUNTER — Encounter: Payer: Self-pay | Admitting: Physician Assistant

## 2017-07-09 DIAGNOSIS — I495 Sick sinus syndrome: Secondary | ICD-10-CM

## 2017-07-09 DIAGNOSIS — R55 Syncope and collapse: Secondary | ICD-10-CM

## 2017-07-09 DIAGNOSIS — I951 Orthostatic hypotension: Secondary | ICD-10-CM | POA: Diagnosis present

## 2017-07-09 DIAGNOSIS — I455 Other specified heart block: Secondary | ICD-10-CM

## 2017-07-09 LAB — CORTISOL-AM, BLOOD: Cortisol - AM: 13.8 ug/dL (ref 6.7–22.6)

## 2017-07-09 LAB — BASIC METABOLIC PANEL
Anion gap: 6 (ref 5–15)
BUN: 8 mg/dL (ref 6–20)
CALCIUM: 8 mg/dL — AB (ref 8.9–10.3)
CO2: 22 mmol/L (ref 22–32)
CREATININE: 0.77 mg/dL (ref 0.44–1.00)
Chloride: 112 mmol/L — ABNORMAL HIGH (ref 101–111)
GFR calc Af Amer: >60 mL/min (ref >60)
GLUCOSE: 117 mg/dL — AB (ref 65–99)
POTASSIUM: 4.4 mmol/L (ref 3.5–5.1)
SODIUM: 140 mmol/L (ref 135–145)

## 2017-07-09 LAB — GLUCOSE, CAPILLARY: GLUCOSE-CAPILLARY: 126 mg/dL — AB (ref 65–99)

## 2017-07-09 LAB — CBC
HEMATOCRIT: 33.1 % — AB (ref 35.0–47.0)
Hemoglobin: 11 g/dL — ABNORMAL LOW (ref 12.0–16.0)
MCH: 29 pg (ref 26.0–34.0)
MCHC: 33.2 g/dL (ref 32.0–36.0)
MCV: 87.1 fL (ref 80.0–100.0)
PLATELETS: 123 10*3/uL — AB (ref 150–440)
RBC: 3.79 MIL/uL — ABNORMAL LOW (ref 3.80–5.20)
RDW: 15.4 % — AB (ref 11.5–14.5)
WBC: 6.2 10*3/uL (ref 3.6–11.0)

## 2017-07-09 LAB — MAGNESIUM: Magnesium: 1.9 mg/dL (ref 1.7–2.4)

## 2017-07-09 LAB — TROPONIN I

## 2017-07-09 LAB — TSH: TSH: 0.482 u[IU]/mL (ref 0.350–4.500)

## 2017-07-09 MED ORDER — FUROSEMIDE 10 MG/ML IJ SOLN
20.0000 mg | Freq: Once | INTRAMUSCULAR | Status: AC
  Administered 2017-07-09: 20 mg via INTRAVENOUS
  Filled 2017-07-09: qty 2

## 2017-07-09 MED ORDER — MIDODRINE HCL 5 MG PO TABS
10.0000 mg | ORAL_TABLET | Freq: Three times a day (TID) | ORAL | Status: DC
  Filled 2017-07-09: qty 2

## 2017-07-09 MED ORDER — SODIUM CHLORIDE 0.9 % IV SOLN
INTRAVENOUS | Status: DC
  Administered 2017-07-09: 08:00:00 via INTRAVENOUS

## 2017-07-09 MED ORDER — IPRATROPIUM-ALBUTEROL 0.5-2.5 (3) MG/3ML IN SOLN
RESPIRATORY_TRACT | Status: AC
  Administered 2017-07-09: 3 mL via RESPIRATORY_TRACT
  Filled 2017-07-09: qty 3

## 2017-07-09 MED ORDER — IPRATROPIUM-ALBUTEROL 0.5-2.5 (3) MG/3ML IN SOLN
3.0000 mL | Freq: Four times a day (QID) | RESPIRATORY_TRACT | Status: DC
  Administered 2017-07-09 – 2017-07-12 (×11): 3 mL via RESPIRATORY_TRACT
  Filled 2017-07-09 (×11): qty 3

## 2017-07-09 MED ORDER — LANREOTIDE ACETATE 120 MG/0.5ML ~~LOC~~ SOLN: 120.0000 mg | Freq: Once | SUBCUTANEOUS | Status: DC

## 2017-07-09 MED ORDER — ENOXAPARIN SODIUM 40 MG/0.4ML ~~LOC~~ SOLN
40.0000 mg | Freq: Once | SUBCUTANEOUS | Status: AC
  Administered 2017-07-09: 40 mg via SUBCUTANEOUS
  Filled 2017-07-09: qty 0.4

## 2017-07-09 NOTE — Care Management (Signed)
Admitted to this facility with the diagnosis of syncope.Marland Kitchen Discharged from this facility 07/06/17 with the same diagnosis. Lives with spouse, Delfino Lovett, [phone]). Sees Elisha Ponder NP  at [facility]. Goes to the [facility] for chemotherapy. (liver cancer).  Last seen Dr, Grayland Ormond 07/06/17. Takes care of all basic activities of daily living herself, drives, if needed.  Prescriptions are filled at [facility] in [facility].  Readmitted 07/12/2017 with the diagnosis of syncope.  Will need pacemaker Will transfer to telemetry unit and get pace maker placed tomorrow (06/23/2017) Shelbie Ammons RN MSN [place] [phone]

## 2017-07-09 NOTE — Progress Notes (Signed)
High fall risk. Bed alarm on. Call bell within reach.  Yellow and red arm bands on.

## 2017-07-09 NOTE — Consult Note (Signed)
Reason for Consult:Syncope Referring Physician: Posey Pronto  CC: Syncope  HPI: Tanya Wells is an 71 y.o. female presenting with recurrent syncope.  Reports that she had her initial syncopal episode about 3 months ago.  Were not frequent until the past week.  Reports that they start with her face turning red.  She will then become short of breath.  At that point she passes out.  Has been found to be orthostatic and have rhythm abnormalities as well.  Consult called to rule out other possibilities.     Past Medical History:  Diagnosis Date  . Anemia   . Depression   . GERD (gastroesophageal reflux disease)   . Hyperlipidemia   . Hypothyroidism   . Liver cyst   . 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 Pulmonary hypertension ([place])    a. TTE 07/06/17: EF 60-65%, nl WM, nl diastolic fxn, mild MR, PASP 38  . Syncope     Past Surgical History:  Procedure Laterality Date  . ABDOMINAL HYSTERECTOMY      Family History  Problem Relation Age of Onset  . Heart disease Mother   . Hypertension Mother   . Skin cancer Father   . Diabetes Sister   . Hypertension Sister   . Diabetes Brother     Social History:  reports that she has never smoked. She has never used smokeless tobacco. She reports that she does not drink alcohol or use drugs.  Allergies  Allergen Reactions  . Codeine Nausea Only    Medications:  I have reviewed the patient's current medications. Prior to Admission:  Prescriptions Prior to Admission  Medication Sig Dispense Refill Last Dose  . atorvastatin (LIPITOR) 20 MG tablet Take 20 mg by mouth daily at 6 PM.    07/07/2017 at 1800  . dorzolamide-timolol (COSOPT) 22.3-6.8 MG/ML ophthalmic solution Administer 1 drop to both eyes Two (2) times a day.   UNKNOWN at UNKNOWN  . estradiol (ESTRACE) 0.5 MG tablet TAKE 1 TABLET EVERY DAY   Past Week at Unknown time  . FLUoxetine (PROZAC) 20 MG capsule Take 20 mg by mouth daily.    Past Week at Unknown time  . fluticasone  (FLONASE) 50 MCG/ACT nasal spray SPRAY 2 SPRAYS IN EACH NOSTRIL DAILY   Past Week at Unknown time  . latanoprost (XALATAN) 0.005 % ophthalmic solution INSTILL ONE DROP TO BOTH EYES EVERY NIGHT   07/07/2017 at 2000  . levothyroxine (SYNTHROID, LEVOTHROID) 125 MCG tablet Take 125 mcg by mouth daily before breakfast.    Past Week at Unknown time   Scheduled: . atorvastatin  20 mg Oral q1800  . dorzolamide-timolol  1 drop Both Eyes BID  . enoxaparin (LOVENOX) injection  40 mg Subcutaneous Once  . estradiol  0.5 mg Oral Daily  . FLUoxetine  20 mg Oral Daily  . latanoprost  1 drop Both Eyes QHS  . levothyroxine  125 mcg Oral QAC breakfast    ROS: History obtained from the patient  General ROS: negative for - chills, fatigue, fever, night sweats, weight gain or weight loss Psychological ROS: negative for - behavioral disorder, hallucinations, memory difficulties, mood swings or suicidal ideation Ophthalmic ROS: negative for - blurry vision, double vision, eye pain or loss of vision ENT ROS: negative for - epistaxis, nasal discharge, oral lesions, sore throat, tinnitus or vertigo Allergy and Immunology ROS: negative for - hives or itchy/watery eyes Hematological and Lymphatic ROS: negative for - bleeding problems, bruising or swollen lymph nodes  Endocrine ROS: negative for - galactorrhea, hair pattern changes, polydipsia/polyuria or temperature intolerance Respiratory ROS: negative for - cough, hemoptysis, shortness of breath or wheezing Cardiovascular ROS: negative for - chest pain, dyspnea on exertion, edema or irregular heartbeat Gastrointestinal ROS: abdominal pain Genito-Urinary ROS: negative for - dysuria, hematuria, incontinence or urinary frequency/urgency Musculoskeletal ROS: negative for - joint swelling or muscular weakness Neurological ROS: as noted in HPI Dermatological ROS: negative for rash and skin lesion changes  Physical Examination: Blood pressure 118/66, pulse (!) 58,  temperature 98 F (36.7 C), temperature source Oral, resp. rate 18, height 4\' 11"  (1.499 m), weight 60.1 kg (132 lb 8 oz), SpO2 100 %.  HEENT-  Normocephalic, no lesions, without obvious abnormality.  Normal external eye and conjunctiva.  Normal TM's bilaterally.  Normal auditory canals and external ears. Normal external nose, mucus membranes and septum.  Normal pharynx. Cardiovascular- S1, S2 normal, pulses palpable throughout   Lungs- chest clear, no wheezing, rales, normal symmetric air entry Abdomen- soft, non-tender; bowel sounds normal; no masses,  no organomegaly Extremities- no edema Lymph-no adenopathy palpable Musculoskeletal-no joint tenderness, deformity or swelling Skin-warm and dry, no hyperpigmentation, vitiligo, or suspicious lesions  Neurological Examination   Mental Status: Alert, oriented, thought content appropriate.  Speech fluent without evidence of aphasia.  Able to follow 3 step commands without difficulty. Cranial Nerves: II: Discs flat bilaterally; Visual fields grossly normal, pupils equal, round, reactive to light and accommodation III,IV, VI: ptosis not present, extra-ocular motions intact bilaterally V,VII: smile symmetric, facial light touch sensation normal bilaterally VIII: hearing normal bilaterally IX,X: gag reflex present XI: bilateral shoulder shrug XII: midline tongue extension Motor: Right : Upper extremity   5/5    Left:     Upper extremity   5/5  Lower extremity   5/5     Lower extremity   5/5 Tone and bulk:normal tone throughout; no atrophy noted Sensory: Pinprick and light touch intact throughout, bilaterally Deep Tendon Reflexes: 2+ and symmetric throughout Plantars: Right: downgoing   Left: downgoing Cerebellar: Normal finger-to-nose and normal heel-to-shin testing bilaterally Gait: not tested due to safety concerns    Laboratory Studies:   Basic Metabolic Panel:  Recent Labs Lab 07/05/17 1732 07/06/17 0418 07/05/2017 1036  07/09/17 0518  NA  --  140 141 140  K  --  4.0 3.3* 4.4  CL  --  109 110 112*  CO2  --  24 24 22   GLUCOSE  --  123* 170* 117*  BUN  --  8 8 8   CREATININE 0.73 0.70 0.84 0.77  CALCIUM  --  8.1* 7.9* 8.0*  MG  --   --   --  1.9    Liver Function Tests:  Recent Labs Lab 06/22/2017 1036  AST 96*  ALT 82*  ALKPHOS 205*  BILITOT 1.0  PROT 5.2*  ALBUMIN 3.1*   No results for input(s): LIPASE, AMYLASE in the last 168 hours. No results for input(s): AMMONIA in the last 168 hours.  CBC:  Recent Labs Lab 07/06/17 0418 06/27/2017 1028 07/09/17 0518  WBC 6.3 9.9 6.2  HGB 11.1* 11.5* 11.0*  HCT 33.8* 34.2* 33.1*  MCV 86.7 87.8 87.1  PLT 141* 144* 123*    Cardiac Enzymes:  Recent Labs Lab 07/06/17 1310 07/07/2017 1036 07/16/2017 1608 07/17/2017 2240 07/09/17 0518  TROPONINI <0.03 0.05* 0.04* 0.03* <0.03    BNP: Invalid input(s): POCBNP  CBG:  Recent Labs Lab 07/04/2017 1200 07/09/17 0643  GLUCAP 134* 126*  Microbiology: No results found for this or any previous visit.  Coagulation Studies: No results for input(s): LABPROT, INR in the last 72 hours.  Urinalysis:  Recent Labs Lab 06/17/2017 1028  COLORURINE YELLOW*  LABSPEC 1.010  PHURINE 6.0  GLUCOSEU NEGATIVE  HGBUR MODERATE*  BILIRUBINUR NEGATIVE  KETONESUR NEGATIVE  PROTEINUR 100*  NITRITE NEGATIVE  LEUKOCYTESUR NEGATIVE    Lipid Panel:  No results found for: CHOL, TRIG, HDL, CHOLHDL, VLDL, LDLCALC  HgbA1C: No results found for: HGBA1C  Urine Drug Screen:  No results found for: LABOPIA, COCAINSCRNUR, LABBENZ, AMPHETMU, THCU, LABBARB  Alcohol Level: No results for input(s): ETH in the last 168 hours.  Other results: EKG: sinus tachycardia at 123 bpm.  Imaging: No results found.   Assessment/Plan: 71 year old with recurrent syncope.  Normal neurological examination.  Etiology likely multifactorial, as discussed by cardiology.  At this point attempts underway to adequately address her issues  of tachy-brady syndrome, sinus pause and OH.  If these attempts are unsuccessful at aborting syncopal events would consider EEG at that time.  No further neurological intervention indicated at this time.    Alexis Goodell, MD Neurology [phone] 07/09/2017, 12:13 PM

## 2017-07-09 NOTE — Progress Notes (Signed)
Pt alert but drowsy.  VSS. Maintaining SB 48-55 on telemetry. 2L oxygen in place. Cardiology PA on unit now to assess patient.

## 2017-07-09 NOTE — Progress Notes (Addendum)
Notified Dr Estanislado Pandy notified of patient passing out-unresponsive for about 10 mins, SOB with ventricular pause at [place] and 0650. Pt diaphoretic.  EKG, O2, IVF and cardiology consult ordered.

## 2017-07-09 NOTE — Progress Notes (Signed)
Chaplain received RRT. Provided ministry of presence and offered silent prayer.     07/09/17 0640  Clinical Encounter Type  Visited With Patient;Health care provider  Visit Type Initial;Spiritual support  Referral From Nurse  Consult/Referral To Chaplain  Spiritual Encounters  Spiritual Needs Other (Comment)

## 2017-07-09 NOTE — Significant Event (Addendum)
Rapid Response Event Note  Overview:  Rapid response called for patient AMS, patient diaphoretic and unresponsive on arrival. Call received from central telemetry that patient had ventricular pause of 5 sec (0645) 17sec (7185)    Initial Focused Assessment: PERL, HR 145,    Interventions: NRB mask previously placed, EKG ordered and Dr. Estanislado Pandy notified. Plan of Care (if not transferred): Maintain patient on current oxygen, continue to monitor. No orders to transfer at this time. Event Summary:            Tanya Wells

## 2017-07-09 NOTE — Progress Notes (Signed)
Patient transferred to room 231 for pacemaker tomorrow. Husband at bedside aware of current plan of care. Patient's belongings transported with patient. Bedside report given to [facility].

## 2017-07-09 NOTE — Progress Notes (Signed)
Rapid Response called on patient-team responded, MD notified.

## 2017-07-09 NOTE — Progress Notes (Signed)
Dr. Posey Pronto on the floor to assess patient.  Pt alert. VSS at this time. NRB in place. NSR 70's on telemetry.  Will continue to closely monitor.

## 2017-07-09 NOTE — Progress Notes (Signed)
[place] [facility] [place] NAME: Tanya Wells    MR#:  [id number]  DATE OF BIRTH:  Sep 04, 1946  SUBJECTIVE:  Patient came in with syncopal episodes./Passing out at home on and off. She was just discharged 2 days ago. Had episode where she had feeling of flushing epigastric discomfort and shortness of breath and nausea. She had similar episode this morning prior to my arrival on the floor. Orthostatic vitals were positive yesterday.  REVIEW OF SYSTEMS:   Review of Systems  Constitutional: Negative for chills, fever and weight loss.  HENT: Negative for ear discharge, ear pain and nosebleeds.   Eyes: Negative for blurred vision, pain and discharge.  Respiratory: Negative for sputum production, shortness of breath, wheezing and stridor.   Cardiovascular: Negative for chest pain, palpitations, orthopnea and PND.  Gastrointestinal: Negative for abdominal pain, diarrhea, nausea and vomiting.  Genitourinary: Negative for frequency and urgency.  Musculoskeletal: Negative for back pain and joint pain.  Neurological: Negative for sensory change, speech change, focal weakness and weakness.  Psychiatric/Behavioral: Negative for depression and hallucinations. The patient is not nervous/anxious.    Tolerating Diet:yes Tolerating PT: pending  DRUG ALLERGIES:   Allergies  Allergen Reactions  . Codeine Nausea Only    VITALS:  Blood pressure 118/66, pulse (!) 58, temperature 98 F (36.7 C), temperature source Oral, resp. rate 18, height 4\' 11"  (1.499 m), weight 60.1 kg (132 lb 8 oz), SpO2 100 %.  PHYSICAL EXAMINATION:   Physical Exam  GENERAL:  71 y.o.-year-old patient lying in the bed with no acute distress.  EYES: Pupils equal, round, reactive to light and accommodation. No scleral icterus. Extraocular muscles intact.  HEENT: Head atraumatic, normocephalic. Oropharynx and nasopharynx clear.  NECK:  Supple, no jugular venous distention. No thyroid  enlargement, no tenderness.  LUNGS: Normal breath sounds bilaterally, no wheezing, rales, rhonchi. No use of accessory muscles of respiration.  CARDIOVASCULAR: S1, S2 normal. No murmurs, rubs, or gallops.  ABDOMEN: Soft, nontender, nondistended. Bowel sounds present. No organomegaly or mass.  EXTREMITIES: No cyanosis, clubbing or edema b/l.    NEUROLOGIC: Cranial nerves II through XII are intact. No focal Motor or sensory deficits b/l.   PSYCHIATRIC:  patient is alert and oriented x 3.  SKIN: No obvious rash, lesion, or ulcer.   LABORATORY PANEL:  CBC  Recent Labs Lab 07/09/17 0518  WBC 6.2  HGB 11.0*  HCT 33.1*  PLT 123*    Chemistries   Recent Labs Lab 06/29/2017 1036 07/09/17 0518  NA 141 140  K 3.3* 4.4  CL 110 112*  CO2 24 22  GLUCOSE 170* 117*  BUN 8 8  CREATININE 0.84 0.77  CALCIUM 7.9* 8.0*  MG  --  1.9  AST 96*  --   ALT 82*  --   ALKPHOS 205*  --   BILITOT 1.0  --    Cardiac Enzymes  Recent Labs Lab 07/09/17 0518  TROPONINI <0.03   RADIOLOGY:  No results found. 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  1. Syncope with significant orthostasis: Continue IV fluids Cardiac evaluation. Patient's telemetry rhythm strip were reviewed and she had bradycardia down into the 50s and had tachycardia into the 130s. She had rhythm strip that showed pause for 7 sec vs heart block. Pacemaker is recommended by Dr. Fletcher Wells which will be placed  tomorrow. Patient had echocardiogram during last hospital stay was unremarkable. Continue orthostatic vital signs Continue telemetry I have added Midodrin for now. Once vitals are stable after pacemaker will consider discontinuing it  2. Malignant carcinoid tumor of the stomach causing orthostasis and flushing Spoke with Dr. Grayland Wells. Patient currently is getting  subcutaneous shots for chemotherapy. She is due for a shot today however since the chemotherapy medication as outpatient will reschedule it at a later date.  3. Elevated troponin due to demand ischemia: Patient had recent auscultation also with elevated troponins which were flat. She was evaluated by [facility] cardiology. Continue telemetry  cardiac enzymes so far negative.  4.HLD: Continue statin  5. Hypothyroidism: Continue Synthroid  6. Depression: Continue Prozac Case discussed with Care Management/Social Worker. Management plans discussed with the patient, family and they are in agreement.  CODE STATUS: full  DVT Prophylaxis: lovenox  TOTAL TIME TAKING CARE OF THIS PATIENT: 30 minutes.  >50% time spent on counselling and coordination of care pt, husband and Dr Tanya Wells  POSSIBLE D/C IN *1-2* DAYS, DEPENDING ON CLINICAL CONDITION.  Note: This dictation was prepared with Dragon dictation along with smaller phrase technology. Any transcriptional errors that result from this process are unintentional.  Tanya Wells M.D on 07/09/2017 at 12:05 PM  Between 7am to 6pm - Pager - [phone]  After 6pm go to [email or web address] - Proofreader  [facility]  Office  [phone]  CC: Primary care physician; Gennette Pac, NP

## 2017-07-09 NOTE — Consult Note (Signed)
Cardiology Consultation Note  Patient ID: Tanya Wells, MRN: [id number], DOB/AGE: 08/05/1946 71 y.o. Admit date: 06/18/2017   Date of Consult: 07/09/2017 Primary Physician: Gennette Pac, NP Primary Cardiologist: New to [facility] - consult by Fletcher Anon Requesting Physician: Dr. Benjie Karvonen, MD  Chief Complaint: Syncope Reason for Consult: Syncope/pause  HPI: Tanya Wells is a 71 y.o. female who is being seen today for the evaluation of syncopep/pause at the request of Dr. Benjie Karvonen, MD. Patient has a h/o metastatic carcinoid tumor of the stomach, followed by Dr. Grayland Ormond, MD, HLD, hypothyroidism, liver cysts, GERD and depression who presented to [facility] on 7/22 with syncope.    Patient was recently admitted the prior week for weakness/fatgiue. Found to have a mildly elevated troponin at outside hospital of 0.06-->0.09 that had down trended to 0.04 upon arrival to [facility]. D-dimer was also noted to be elevated at > 5000. CTA chest was negative for PE, LE ultrasound was negative for DVT. She reportedly had heart rates into the 120s bpm, though there were no strips for review. She underwent echo that showed normal LV systolic function with an EF of 60-65%, normal wall motion, normal diastolic function parameters, mild mitral regurgitation, PASP mildly elevated at 38 mmHg. She was not placed on telemetry that admission. It was felt her mildly elevated troponin was in the setting of poor PO intake with hypotension. She was discharged prior to cardiology MD staffing. She returned to [facility] on 7/22 with syncope.   Patient and her husband now indicate she has had 3 to 4 syncopal episodes over the past 3 months. None of these episodes occur with positional changes and are associated with preceding flushing, SOB, and diaphoresis. She most recently had a syncopal episode on 7/22 in the morning after getting up, walking into the [employer], making coffee, and walking back to her bedroom. She told her husband at that time that she did not  feel well and felt like she was going to "pass out." There was associated SOB, flushing, and diaphoresis. No associated chest pain or palpitations.   Upon the patient's arrival to [facility] they were found to have BP 107/64, HR 56, temp 97.4, oxygen saturation 98% on room air, weight 127 pounds. EKGs as below, CXR not performed. Labs showed troponin 0.05-->0.04-->0.03--><0.03, WBC 9.9, HGB 11.5, PLT 144, K+ 3.3-->4.4, SCr 0.84, albumin 3.1, AST 96, ALT 82, alk phos 205. Orthostatic vital signs after IV fluids remained positive with increase in HR from 54-->80 when lying to sitting and nearly positive with drop in SBP from 104-->86 from sitting to standing. On the morning of 7/23, patient apparently became unresponsive for ~ 10 minutes per notes with pause of 6.3 seconds at 6:45 AM and 7.1 seconds at 6:50 AM. Rapid response was called. She has intermittently had narrow complex tachycardia with rates into the 150s bpm. Currently with heart rates in the upper 40s bpm. Currently, asymptomatic with heart rates in the 50s to 60s bpm.   Past Medical History:  Diagnosis Date  . Anemia   . Depression   . GERD (gastroesophageal reflux disease)   . Hyperlipidemia   . Hypothyroidism   . Liver cyst   . Malignant carcinoid tumor of stomach ([place])   . Pulmonary hypertension ([place])    a. TTE 07/06/17: EF 60-65%, nl WM, nl diastolic fxn, mild MR, PASP 38  . Syncope       Most Recent Cardiac Studies: TTE 07/06/2017: Study Conclusions  - Left ventricle: The cavity size was  normal. Wall thickness was   normal. Systolic function was normal. The estimated ejection   fraction was in the range of 60% to 65%. Wall motion was normal;   there were no regional wall motion abnormalities. Left   ventricular diastolic function parameters were normal. - Mitral valve: There was mild regurgitation. - Pulmonary arteries: Systolic pressure was mildly increased. PA   peak pressure: 38 mm Hg (S).   Surgical History:  Past  Surgical History:  Procedure Laterality Date  . ABDOMINAL HYSTERECTOMY       Home Meds: Prior to Admission medications   Medication Sig Start Date End Date Taking? Authorizing Provider  atorvastatin (LIPITOR) 20 MG tablet Take 20 mg by mouth daily at 6 PM.  10/17/16 10/17/17 Yes [provider]  dorzolamide-timolol (COSOPT) 22.3-6.8 MG/ML ophthalmic solution Administer 1 drop to both eyes Two (2) times a day. 02/11/14  Yes [provider]  doxycycline (VIBRA-TABS) 100 MG tablet Take 100 mg by mouth 2 (two) times daily. 07/04/17 16-Jul-2017 Yes [provider]  estradiol (ESTRACE) 0.5 MG tablet TAKE 1 TABLET EVERY DAY 11/07/16  Yes [provider]  FLUoxetine (PROZAC) 20 MG capsule Take 20 mg by mouth daily.  10/12/16  Yes [provider]  fluticasone (FLONASE) 50 MCG/ACT nasal spray SPRAY 2 SPRAYS IN EACH NOSTRIL DAILY 03/02/17  Yes [provider]  latanoprost (XALATAN) 0.005 % ophthalmic solution INSTILL ONE DROP TO BOTH EYES EVERY NIGHT 09/28/15  Yes [provider]  levothyroxine (SYNTHROID, LEVOTHROID) 125 MCG tablet Take 125 mcg by mouth daily before breakfast.  01/18/17  Yes [provider]    Inpatient Medications:  . atorvastatin  20 mg Oral q1800  . dorzolamide-timolol  1 drop Both Eyes BID  . enoxaparin (LOVENOX) injection  40 mg Subcutaneous Q24H  . estradiol  0.5 mg Oral Daily  . FLUoxetine  20 mg Oral Daily  . latanoprost  1 drop Both Eyes QHS  . levothyroxine  125 mcg Oral QAC breakfast  . midodrine  10 mg Oral TID WC   . sodium chloride 75 mL/hr at 07/09/17 9604    Allergies:  Allergies  Allergen Reactions  . Codeine Nausea Only    Social History   Social History  . Marital status: Married    Spouse name: N/A  . Number of children: N/A  . Years of education: N/A   Occupational History  . Not on file.   Social History Main Topics  . Smoking status: Never Smoker  . Smokeless tobacco: Never  Used  . Alcohol use No  . Drug use: No  . Sexual activity: No   Other Topics Concern  . Not on file   Social History Narrative  . No narrative on file     Family History  Problem Relation Age of Onset  . Heart disease Mother   . Hypertension Mother   . Skin cancer Father   . Diabetes Sister   . Hypertension Sister   . Diabetes Brother      Review of Systems: Review of Systems  Constitutional: Positive for malaise/fatigue. Negative for chills, diaphoresis, fever and weight loss.  HENT: Negative for congestion.   Eyes: Negative for discharge and redness.  Respiratory: Negative for cough, hemoptysis, sputum production, shortness of breath and wheezing.   Cardiovascular: Negative for chest pain, palpitations, orthopnea, claudication, leg swelling and PND.  Gastrointestinal: Negative for abdominal pain, blood in stool, heartburn, melena, nausea and vomiting.  Genitourinary: Negative for hematuria.  Musculoskeletal:  Negative for falls and myalgias.  Skin: Negative for rash.  Neurological: Positive for weakness. Negative for dizziness, tingling, tremors, sensory change, speech change, focal weakness and loss of consciousness.  Endo/Heme/Allergies: Does not bruise/bleed easily.  Psychiatric/Behavioral: Negative for substance abuse. The patient is not nervous/anxious.   All other systems reviewed and are negative.   Labs:  Recent Labs  07/04/2017 1036 06/20/2017 1608 07/02/2017 2240 07/09/17 0518  TROPONINI 0.05* 0.04* 0.03* <0.03   Lab Results  Component Value Date   WBC 6.2 07/09/2017   HGB 11.0 (L) 07/09/2017   HCT 33.1 (L) 07/09/2017   MCV 87.1 07/09/2017   PLT 123 (L) 07/09/2017     Recent Labs Lab 06/29/2017 1036 07/09/17 0518  NA 141 140  K 3.3* 4.4  CL 110 112*  CO2 24 22  BUN 8 8  CREATININE 0.84 0.77  CALCIUM 7.9* 8.0*  PROT 5.2*  --   BILITOT 1.0  --   ALKPHOS 205*  --   ALT 82*  --   AST 96*  --   GLUCOSE 170* 117*   No results found for: CHOL,  HDL, LDLCALC, TRIG No results found for: DDIMER  Radiology/Studies:  Ct Angio Chest Pe W Or Wo Contrast  Result Date: 07/05/2017 IMPRESSION: 1. No pulmonary embolus. 2. No other acute thoracic abnormality. 3. Hepatic metastases demonstrated on prior PET CT are not visible on this study. Electronically Signed   By: Ulyses Jarred M.D.   On: 07/05/2017 22:26    EKG: Interpreted by me showed: sinus bradycardia, 50 bpm, nonspecific IVCD, low voltage QRS. Sinus tachycardia, 132 bpm, left axis deviation, low voltage QRS, nonspecific st/t changes Telemetry: Interpreted by me showed: sinus bradycardia upper 40s to low 50s currently, marked sinus bradycardia in the low 30s bpm with longest R-R interval of 6.3 seconds at 6:45 AM, occasional PVCs in a pattern of ventricular trigeminy, narrow complex tachycardia into the 150s bpm  Weights: Filed Weights   06/28/2017 1026 06/18/2017 1630  Weight: 127 lb (57.6 kg) 132 lb 8 oz (60.1 kg)     Physical Exam: Blood pressure 118/66, pulse 64, temperature 98 F (36.7 C), temperature source Oral, resp. rate 18, height _0  (1.499 m), weight 132 lb 8 oz (60.1 kg), SpO2 100 %. Body mass index is 26.76 kg/m. General: Well developed, well nourished, in no acute distress. Head: Normocephalic, atraumatic, sclera non-icteric, no xanthomas, nares are without discharge.  Neck: Negative for carotid bruits. JVD not elevated. Lungs: Clear bilaterally to auscultation without wheezes, rales, or rhonchi. Breathing is unlabored. Heart: Bradycardic, with S1 S2. No murmurs, rubs, or gallops appreciated. Abdomen: Soft, non-tender, non-distended with normoactive bowel sounds. No hepatomegaly. No rebound/guarding. No obvious abdominal masses. Msk:  Strength and tone appear normal for age. Extremities: No clubbing or cyanosis. No edema. Distal pedal pulses are 2+ and equal bilaterally. Neuro: Alert and oriented X 3. No facial asymmetry. No focal deficit. Moves all extremities  spontaneously. Psych:  Responds to questions appropriately with a normal affect.    Assessment and Plan:  Principal Problem:   Syncope Active Problems:   Carcinoid tumor   Orthostatic hypotension   Sinus pause   Tachycardia-bradycardia syndrome (HCC)   Elevated troponin    1. Syncope/sinus pause/tachy-brady syndrome: -Likely multifactorial including tachy-brady syndrome, sinus pause, orthostasis and in the setting of known carcinoid tumor -Not on any rate limiting medications, including eye drops -Will likely need PPM implantation given documented pauses on telemetry and tachy-brady syndrome -Per IM, there  are no contraindications for PPM per oncology  -Potassium has been repleted -Check TSH and magnesium as below -Monitor on telemetry -Check carotid ultrasound  2. Orthostatic hypotension: -IV fluids -Midodrine per IM -Not on any antihypertensives at home  3. Elevated troponin: -Never with chest pain -Likely supply demand ischemia in the setting of hypotension with syncope/tachy-brady syndrome -Recent TTE as above -Consider outpatient Lexiscan Myoview to evaluate for high-risk ischemia  4. Hypokalemia: -Repleted -Check magnesium with repletion to a goal of at least 2.0  4. Carcinoid tumor: -No contraindications for PPM per IM  5. Hypothyroidism: -Check TSH   Signed, Christell Faith, PA-C [place] Pager: [phone] 07/09/2017, 10:07 AM

## 2017-07-10 ENCOUNTER — Encounter: Payer: Self-pay | Admitting: *Deleted

## 2017-07-10 ENCOUNTER — Inpatient Hospital Stay: Payer: Medicare Other | Admitting: Anesthesiology

## 2017-07-10 ENCOUNTER — Inpatient Hospital Stay: Payer: Medicare Other

## 2017-07-10 ENCOUNTER — Encounter: Admission: EM | Disposition: E | Payer: Self-pay | Source: Home / Self Care | Attending: Internal Medicine

## 2017-07-10 ENCOUNTER — Inpatient Hospital Stay
Admit: 2017-07-10 | Discharge: 2017-07-10 | Disposition: A | Discharge status: Home / Self Care | Payer: Medicare Other | Attending: Pulmonary Disease | Admitting: Pulmonary Disease

## 2017-07-10 DIAGNOSIS — J9601 Acute respiratory failure with hypoxia: Secondary | ICD-10-CM

## 2017-07-10 DIAGNOSIS — D3A00 Benign carcinoid tumor of unspecified site: Secondary | ICD-10-CM

## 2017-07-10 DIAGNOSIS — J9690 Respiratory failure, unspecified, unspecified whether with hypoxia or hypercapnia: Secondary | ICD-10-CM

## 2017-07-10 DIAGNOSIS — I5021 Acute systolic (congestive) heart failure: Secondary | ICD-10-CM

## 2017-07-10 DIAGNOSIS — Z452 Encounter for adjustment and management of vascular access device: Secondary | ICD-10-CM

## 2017-07-10 DIAGNOSIS — I469 Cardiac arrest, cause unspecified: Secondary | ICD-10-CM

## 2017-07-10 DIAGNOSIS — I495 Sick sinus syndrome: Secondary | ICD-10-CM

## 2017-07-10 HISTORY — PX: PACEMAKER INSERTION: SHX728

## 2017-07-10 LAB — COMPREHENSIVE METABOLIC PANEL
ALT: 113 U/L — ABNORMAL HIGH (ref 14–54)
ANION GAP: 14 (ref 5–15)
AST: 145 U/L — ABNORMAL HIGH (ref 15–41)
Albumin: 2 g/dL — ABNORMAL LOW (ref 3.5–5.0)
Alkaline Phosphatase: 141 U/L — ABNORMAL HIGH (ref 38–126)
BUN: 9 mg/dL (ref 6–20)
CALCIUM: 6.5 mg/dL — AB (ref 8.9–10.3)
CHLORIDE: 97 mmol/L — AB (ref 101–111)
CO2: 45 mmol/L — AB (ref 22–32)
CREATININE: 0.9 mg/dL (ref 0.44–1.00)
Glucose, Bld: 209 mg/dL — ABNORMAL HIGH (ref 65–99)
Potassium: 3.1 mmol/L — ABNORMAL LOW (ref 3.5–5.1)
SODIUM: 156 mmol/L — AB (ref 135–145)
Total Bilirubin: 1.1 mg/dL (ref 0.3–1.2)
Total Protein: 3.5 g/dL — ABNORMAL LOW (ref 6.5–8.1)

## 2017-07-10 LAB — CBC
HCT: 39.2 % (ref 35.0–47.0)
Hemoglobin: 12.7 g/dL (ref 12.0–16.0)
MCH: 29.3 pg (ref 26.0–34.0)
MCHC: 32.4 g/dL (ref 32.0–36.0)
MCV: 90.3 fL (ref 80.0–100.0)
PLATELETS: 127 10*3/uL — AB (ref 150–440)
RBC: 4.35 MIL/uL (ref 3.80–5.20)
RDW: 15.8 % — AB (ref 11.5–14.5)
WBC: 21.2 10*3/uL — ABNORMAL HIGH (ref 3.6–11.0)

## 2017-07-10 LAB — BLOOD GAS, ARTERIAL
ACID-BASE EXCESS: 23.2 mmol/L — AB (ref 0.0–2.0)
BICARBONATE: 50.3 mmol/L — AB (ref 20.0–28.0)
FIO2: 1
LHR: 18 {breaths}/min
MECHVT: 480 mL
O2 SAT: 99 %
PATIENT TEMPERATURE: 37
PEEP/CPAP: 5 cmH2O
PH ART: 7.49 — AB (ref 7.350–7.450)
PO2 ART: 124 mmHg — AB (ref 83.0–108.0)
pCO2 arterial: 66 mmHg (ref 32.0–48.0)

## 2017-07-10 LAB — GLUCOSE, CAPILLARY: GLUCOSE-CAPILLARY: 146 mg/dL — AB (ref 65–99)

## 2017-07-10 LAB — TROPONIN I
TROPONIN I: 0.8 ng/mL — AB (ref <0.03)
Troponin I: 0.91 ng/mL (ref <0.03)

## 2017-07-10 SURGERY — INSERTION, CARDIAC PACEMAKER
Anesthesia: Monitor Anesthesia Care | Wound class: Clean

## 2017-07-10 SURGERY — INSERTION, CARDIAC PACEMAKER
Anesthesia: Moderate Sedation

## 2017-07-10 MED ORDER — NOREPINEPHRINE BITARTRATE 1 MG/ML IV SOLN
0.0000 ug/min | INTRAVENOUS | Status: DC
  Filled 2017-07-10: qty 4

## 2017-07-10 MED ORDER — MIDAZOLAM HCL 2 MG/2ML IJ SOLN: 1.0000 mg | INTRAMUSCULAR | Status: DC | PRN

## 2017-07-10 MED ORDER — CEFAZOLIN SODIUM-DEXTROSE 1-4 GM/50ML-% IV SOLN
1.0000 g | Freq: Four times a day (QID) | INTRAVENOUS | Status: AC
  Administered 2017-07-10 – 2017-07-11 (×3): 1 g via INTRAVENOUS
  Filled 2017-07-10 (×5): qty 50

## 2017-07-10 MED ORDER — CHLORHEXIDINE GLUCONATE 0.12% ORAL RINSE (MEDLINE KIT)
15.0000 mL | Freq: Two times a day (BID) | OROMUCOSAL | Status: DC
  Administered 2017-07-10 – 2017-07-13 (×5): 15 mL via OROMUCOSAL

## 2017-07-10 MED ORDER — SODIUM CHLORIDE 0.9 % IR SOLN
Status: DC | PRN
  Administered 2017-07-10: 360 mL

## 2017-07-10 MED ORDER — STERILE WATER FOR INJECTION IV SOLN
INTRAVENOUS | Status: DC
  Administered 2017-07-10: 21:00:00 via INTRAVENOUS
  Filled 2017-07-10 (×3): qty 850

## 2017-07-10 MED ORDER — PHENYLEPHRINE HCL 10 MG/ML IJ SOLN
INTRAMUSCULAR | Status: DC | PRN
  Administered 2017-07-10: 100 ug via INTRAVENOUS

## 2017-07-10 MED ORDER — LIDOCAINE 1 % OPTIME INJ - NO CHARGE
INTRAMUSCULAR | Status: DC | PRN
  Administered 2017-07-10: 30 mL

## 2017-07-10 MED ORDER — LACTATED RINGERS IV SOLN
INTRAVENOUS | Status: DC
  Administered 2017-07-10 (×4): via INTRAVENOUS

## 2017-07-10 MED ORDER — ACETAMINOPHEN 650 MG RE SUPP: 650.0000 mg | Freq: Four times a day (QID) | RECTAL | Status: DC | PRN

## 2017-07-10 MED ORDER — CHLORHEXIDINE GLUCONATE 0.12 % MT SOLN
OROMUCOSAL | Status: AC
  Filled 2017-07-10: qty 15

## 2017-07-10 MED ORDER — MIDAZOLAM HCL 2 MG/2ML IJ SOLN
1.0000 mg | INTRAMUSCULAR | Status: DC | PRN
  Filled 2017-07-10: qty 2

## 2017-07-10 MED ORDER — DOCUSATE SODIUM 50 MG/5ML PO LIQD
100.0000 mg | Freq: Two times a day (BID) | ORAL | Status: DC | PRN
  Filled 2017-07-10: qty 10

## 2017-07-10 MED ORDER — ATORVASTATIN CALCIUM 20 MG PO TABS
20.0000 mg | ORAL_TABLET | Freq: Every day | ORAL | Status: DC
  Administered 2017-07-11 – 2017-07-13 (×3): 20 mg
  Filled 2017-07-10 (×3): qty 1

## 2017-07-10 MED ORDER — ORAL CARE MOUTH RINSE
15.0000 mL | OROMUCOSAL | Status: DC
  Administered 2017-07-11 (×3): 15 mL via OROMUCOSAL

## 2017-07-10 MED ORDER — FAMOTIDINE IN NACL 20-0.9 MG/50ML-% IV SOLN
20.0000 mg | Freq: Two times a day (BID) | INTRAVENOUS | Status: DC
  Administered 2017-07-10 – 2017-07-12 (×5): 20 mg via INTRAVENOUS
  Filled 2017-07-10 (×7): qty 50

## 2017-07-10 MED ORDER — FENTANYL CITRATE (PF) 100 MCG/2ML IJ SOLN: 50.0000 ug | INTRAMUSCULAR | Status: DC | PRN

## 2017-07-10 MED ORDER — ONDANSETRON HCL 4 MG/2ML IJ SOLN: 4.0000 mg | Freq: Four times a day (QID) | INTRAMUSCULAR | Status: DC | PRN

## 2017-07-10 MED ORDER — ACETAMINOPHEN 325 MG PO TABS: 650.0000 mg | ORAL_TABLET | Freq: Four times a day (QID) | ORAL | Status: DC | PRN

## 2017-07-10 MED ORDER — CHLORHEXIDINE GLUCONATE 0.12% ORAL RINSE (MEDLINE KIT)
15.0000 mL | Freq: Two times a day (BID) | OROMUCOSAL | Status: DC
  Administered 2017-07-10: 15 mL via OROMUCOSAL

## 2017-07-10 MED ORDER — FENTANYL CITRATE (PF) 100 MCG/2ML IJ SOLN: 25.0000 ug | INTRAMUSCULAR | Status: DC | PRN

## 2017-07-10 MED ORDER — PROPOFOL 500 MG/50ML IV EMUL
INTRAVENOUS | Status: DC | PRN
  Administered 2017-07-10: 75 ug/kg/min via INTRAVENOUS

## 2017-07-10 MED ORDER — ACETAMINOPHEN 325 MG PO TABS: 325.0000 mg | ORAL_TABLET | ORAL | Status: DC | PRN

## 2017-07-10 MED ORDER — PROPOFOL 10 MG/ML IV BOLUS
INTRAVENOUS | Status: DC | PRN
  Administered 2017-07-10: 40 mg via INTRAVENOUS

## 2017-07-10 MED ORDER — BISACODYL 10 MG RE SUPP: 10.0000 mg | Freq: Every day | RECTAL | Status: DC | PRN

## 2017-07-10 MED ORDER — GENTAMICIN SULFATE 40 MG/ML IJ SOLN
INTRAMUSCULAR | Status: AC
  Filled 2017-07-10: qty 2

## 2017-07-10 MED ORDER — FENTANYL CITRATE (PF) 100 MCG/2ML IJ SOLN
50.0000 ug | INTRAMUSCULAR | Status: DC | PRN
  Administered 2017-07-10 – 2017-07-14 (×4): 50 ug via INTRAVENOUS
  Filled 2017-07-10 (×5): qty 2

## 2017-07-10 MED ORDER — IOPAMIDOL (ISOVUE-370) INJECTION 76%
75.0000 mL | Freq: Once | INTRAVENOUS | Status: AC | PRN
  Administered 2017-07-10: 75 mL via INTRAVENOUS

## 2017-07-10 MED ORDER — PROPOFOL 500 MG/50ML IV EMUL
INTRAVENOUS | Status: AC
  Filled 2017-07-10: qty 50

## 2017-07-10 MED ORDER — ONDANSETRON HCL 4 MG/2ML IJ SOLN: 4.0000 mg | Freq: Once | INTRAMUSCULAR | Status: DC | PRN

## 2017-07-10 MED ORDER — CEFAZOLIN SODIUM-DEXTROSE 1-4 GM/50ML-% IV SOLN
INTRAVENOUS | Status: DC | PRN
  Administered 2017-07-10: 1 g via INTRAVENOUS

## 2017-07-10 MED ORDER — MIDAZOLAM HCL 2 MG/2ML IJ SOLN
INTRAMUSCULAR | Status: AC
  Filled 2017-07-10: qty 2

## 2017-07-10 MED ORDER — CEFAZOLIN SODIUM 1 G IJ SOLR
INTRAMUSCULAR | Status: AC
  Filled 2017-07-10: qty 10

## 2017-07-10 MED ORDER — ORAL CARE MOUTH RINSE: 15.0000 mL | Freq: Four times a day (QID) | OROMUCOSAL | Status: DC

## 2017-07-10 SURGICAL SUPPLY — 36 items
BAG DECANTER FOR FLEXI CONT (MISCELLANEOUS) ×3 IMPLANT
BRUSH SCRUB 4% CHG (MISCELLANEOUS) ×3 IMPLANT
CABLE SURG 12 DISP A/V CHANNEL (MISCELLANEOUS) ×3 IMPLANT
CANISTER SUCT 1200ML W/VALVE (MISCELLANEOUS) ×3 IMPLANT
CHLORAPREP W/TINT 26ML (MISCELLANEOUS) ×3 IMPLANT
COVER LIGHT HANDLE STERIS (MISCELLANEOUS) ×6 IMPLANT
COVER MAYO STAND STRL (DRAPES) ×3 IMPLANT
DRAPE C-ARM XRAY 36X54 (DRAPES) ×3 IMPLANT
DRSG TEGADERM 4X4.75 (GAUZE/BANDAGES/DRESSINGS) ×3 IMPLANT
DRSG TELFA 4X3 1S NADH ST (GAUZE/BANDAGES/DRESSINGS) ×3 IMPLANT
ELECT REM PT RETURN 9FT ADLT (ELECTROSURGICAL) ×3
ELECTRODE REM PT RTRN 9FT ADLT (ELECTROSURGICAL) ×1 IMPLANT
GLOVE BIO SURGEON STRL SZ7.5 (GLOVE) ×6 IMPLANT
GLOVE BIO SURGEON STRL SZ8 (GLOVE) ×6 IMPLANT
GOWN STRL REUS W/ TWL LRG LVL3 (GOWN DISPOSABLE) ×1 IMPLANT
GOWN STRL REUS W/ TWL XL LVL3 (GOWN DISPOSABLE) ×1 IMPLANT
GOWN STRL REUS W/TWL LRG LVL3 (GOWN DISPOSABLE) ×2
GOWN STRL REUS W/TWL XL LVL3 (GOWN DISPOSABLE) ×2
IMMOBILIZER SHDR MD LX WHT (SOFTGOODS) IMPLANT
IMMOBILIZER SHDR XL LX WHT (SOFTGOODS) IMPLANT
INTRO PACEMAKR LEAD 9FR 13CM (INTRODUCER) ×3
INTRO PACEMKR SHEATH II 7FR (MISCELLANEOUS) ×3
INTRODUCER PACEMKR LD 9FR 13CM (INTRODUCER) ×1 IMPLANT
INTRODUCER PACEMKR SHTH II 7FR (MISCELLANEOUS) ×1 IMPLANT
IPG PACE AZUR XT DR MRI W1DR01 (Pacemaker) ×1 IMPLANT
IV NS 500ML (IV SOLUTION) ×2
IV NS 500ML BAXH (IV SOLUTION) ×1 IMPLANT
KIT RM TURNOVER STRD PROC AR (KITS) ×3 IMPLANT
LABEL OR SOLS (LABEL) ×3 IMPLANT
LEAD CAPSURE NOVUS 45CM (Lead) ×3 IMPLANT
LEAD CAPSURE NOVUS 5076-52CM (Lead) ×3 IMPLANT
MARKER SKIN DUAL TIP RULER LAB (MISCELLANEOUS) ×3 IMPLANT
PACE AZURE XT DR MRI W1DR01 (Pacemaker) ×3 IMPLANT
PACK PACE INSERTION (MISCELLANEOUS) ×3 IMPLANT
PAD ONESTEP ZOLL R SERIES ADT (MISCELLANEOUS) ×3 IMPLANT
SUT SILK 0 SH 30 (SUTURE) ×6 IMPLANT

## 2017-07-10 NOTE — Progress Notes (Addendum)
Patient arrived on the unit from floor intubated with HR in the 110'Y and systolic in the 34'J.Pt was cyanotic. ED doctor at bedside. Dr. Mortimer Fries was called. Dr. Saralyn Pilar called. EL[facility] called. Received orders from [facility] Dr. Alva Garnet) and administered one round of epi, 1000 mL bolus of NS and started pt on levophed. Pt was placed on the ventilator. Stat EKG ordered. Dr. Mortimer Fries arrived at bedside. 6 amps of sodium bicarb was administered. Central line placed.

## 2017-07-10 NOTE — Progress Notes (Signed)
[place] [facility] [place] NAME: Tanya Wells    MR#:  [id number]  DATE OF BIRTH:  08-11-1946  SUBJECTIVE:   Patient just got back from pacemaker placement. Denies any complaints. REVIEW OF SYSTEMS:   Review of Systems  Constitutional: Negative for chills, fever and weight loss.  HENT: Negative for ear discharge, ear pain and nosebleeds.   Eyes: Negative for blurred vision, pain and discharge.  Respiratory: Negative for sputum production, shortness of breath, wheezing and stridor.   Cardiovascular: Negative for chest pain, palpitations, orthopnea and PND.  Gastrointestinal: Negative for abdominal pain, diarrhea, nausea and vomiting.  Genitourinary: Negative for frequency and urgency.  Musculoskeletal: Negative for back pain and joint pain.  Neurological: Negative for sensory change, speech change, focal weakness and weakness.  Psychiatric/Behavioral: Negative for depression and hallucinations. The patient is not nervous/anxious.    Tolerating Diet:yes Tolerating PT: pending  DRUG ALLERGIES:   Allergies  Allergen Reactions  . Codeine Nausea Only    VITALS:  Blood pressure (!) 149/88, pulse 60, temperature 98.1 F (36.7 C), temperature source Oral, resp. rate 18, height 4\' 11"  (1.499 m), weight 59.9 kg (132 lb), SpO2 100 %.  PHYSICAL EXAMINATION:   Physical Exam  GENERAL:  71 y.o.-year-old patient lying in the bed with no acute distress.  EYES: Pupils equal, round, reactive to light and accommodation. No scleral icterus. Extraocular muscles intact.  HEENT: Head atraumatic, normocephalic. Oropharynx and nasopharynx clear.  NECK:  Supple, no jugular venous distention. No thyroid enlargement, no tenderness.  LUNGS: Normal breath sounds bilaterally, no wheezing, rales, rhonchi. No use of accessory muscles of respiration. Left upper chest wall dressing for pacemaker present CARDIOVASCULAR: S1, S2 normal. No murmurs, rubs, or gallops.   ABDOMEN: Soft, nontender, nondistended. Bowel sounds present. No organomegaly or mass.  EXTREMITIES: No cyanosis, clubbing or edema b/l.    NEUROLOGIC: Cranial nerves II through XII are intact. No focal Motor or sensory deficits b/l.   PSYCHIATRIC:  patient is alert and oriented x 3.  SKIN: No obvious rash, lesion, or ulcer.   LABORATORY PANEL:  CBC  Recent Labs Lab 07/09/17 0518  WBC 6.2  HGB 11.0*  HCT 33.1*  PLT 123*    Chemistries   Recent Labs Lab 07/12/2017 1036 07/09/17 0518  NA 141 140  K 3.3* 4.4  CL 110 112*  CO2 24 22  GLUCOSE 170* 117*  BUN 8 8  CREATININE 0.84 0.77  CALCIUM 7.9* 8.0*  MG  --  1.9  AST 96*  --   ALT 82*  --   ALKPHOS 205*  --   BILITOT 1.0  --    Cardiac Enzymes  Recent Labs Lab 07/09/17 0518  TROPONINI <0.03   RADIOLOGY:  US Carotid Bilateral  Result Date: 07/09/2017 CLINICAL DATA:  71 year old female with syncope EXAM: BILATERAL CAROTID DUPLEX ULTRASOUND TECHNIQUE: Pearline Cables scale imaging, color Doppler and duplex ultrasound were performed of bilateral carotid and vertebral arteries in the neck. COMPARISON:  None. FINDINGS: Criteria: Quantification of carotid stenosis is based on velocity parameters that correlate the residual internal carotid diameter with NASCET-based stenosis levels, using the diameter of the distal internal carotid lumen as the denominator for stenosis measurement. The following velocity measurements were obtained: RIGHT ICA:  55/14 cm/sec CCA:  58/52 cm/sec SYSTOLIC ICA/CCA RATIO:  0.9 DIASTOLIC ICA/CCA RATIO:  7.78 ECA:  86 cm/sec LEFT ICA:  58/15 cm/sec CCA:  24/23 cm/sec SYSTOLIC ICA/CCA RATIO:  0.9 DIASTOLIC ICA/CCA RATIO:  1.2 ECA:  55 cm/sec RIGHT CAROTID ARTERY: Mild focal heterogeneous atherosclerotic plaque in the proximal internal carotid artery. By peak systolic velocity criteria, the estimated stenosis remains less than 50%. RIGHT VERTEBRAL ARTERY:  Patent with normal antegrade flow. LEFT CAROTID ARTERY: No  significant atherosclerotic plaque or evidence of stenosis. LEFT VERTEBRAL ARTERY:  Patent with normal antegrade flow. IMPRESSION: 1. Mild (1-49%) stenosis proximal right internal carotid artery secondary to focal heterogeneous atherosclerotic plaque. 2. No significant atherosclerotic plaque or evidence of stenosis in the left internal carotid artery. 3. Vertebral arteries are patent with antegrade flow. Signed, Criselda Peaches, MD [facility] Electronically Signed   By: Jacqulynn Cadet M.D.   On: 07/09/2017 18:23   Dg Chest Port 1 View  Result Date: 07/09/2017 CLINICAL DATA:  Post pacemaker insertion, history of carcinoid tumor of the stomach EXAM: PORTABLE CHEST 1 VIEW COMPARISON:  CT chest of 07/05/2017 and chest x-ray of the same day FINDINGS: The heart is borderline enlarged and there is some fullness of the perihilar vasculature suggesting mild fluid overload. Tiny pleural effusions cannot be excluded. Permanent pacemaker is now present. No bony abnormality is seen. IMPRESSION: 1. Question mild pulmonary vascular congestion. 2. Cannot exclude small pleural effusions. 3. Permanent pacemaker is present. Electronically Signed   By: Ivar Drape M.D.   On: 06/26/2017 13:53   Dg C-arm 1-60 Min-no Report  Result Date: 07/15/2017 Fluoroscopy was utilized by the requesting physician.  No radiographic interpretation.   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  1. Syncope with significant orthostasis: Cardiac evaluation. Patient's telemetry rhythm strip were reviewed and she had bradycardia down into the 50s and had tachycardia into the 130s. She had rhythm strip that showed pause for 7 sec vs heart block. Pacemaker is recommended by Dr. Fletcher Anon  -Patient is status post pacemaker  placement by Dr. Josefa Half Patient had echocardiogram during last hospital stay was unremarkable.  2. Malignant carcinoid tumor of the stomach causing orthostasis and flushing Spoke with Dr. Grayland Ormond. Patient currently is getting subcutaneous shots for chemotherapy. She is due for a shot today however since the chemotherapy medication as outpatient will reschedule it at a later date.  3. Elevated troponin due to demand ischemia: Patient had recent auscultation also with elevated troponins which were flat. She was evaluated by [facility] cardiology. Continue telemetry  cardiac enzymes so far negative.  4.HLD: Continue statin  5. Hypothyroidism: Continue Synthroid  6. Depression: Continue Prozac  Overall improving. Discussed with husband.  Case discussed with Care Management/Social Worker. Management plans discussed with the patient, family and they are in agreement.  CODE STATUS: full  DVT Prophylaxis: lovenox  TOTAL TIME TAKING CARE OF THIS PATIENT: 30 minutes.  >50% time spent on counselling and coordination of care pt, husband and Dr Fletcher Anon  POSSIBLE D/C IN *1-2* DAYS, DEPENDING ON CLINICAL CONDITION.  Note: This dictation was prepared with Dragon dictation along with smaller phrase technology. Any transcriptional errors that result from this process are unintentional.  Signa Cheek M.D on 06/27/2017 at 3:26 PM  Between 7am to 6pm - Pager - [phone]  After 6pm go to [email or web address] - Proofreader  [facility]  Office  [phone]  CC: Primary care physician; Gennette Pac, NP

## 2017-07-10 NOTE — Anesthesia Post-op Follow-up Note (Cosign Needed)
Anesthesia QCDR form completed.        

## 2017-07-10 NOTE — Care Management (Signed)
Transferred to 2A after becoming unresponsive on 1C.  Telemetry showed sinus pauses/tachybrady syndrome.  Cardiology consulting.  No issues per oncology for PPM insertion.  Currently undergoing chemo for carcinoid tumor

## 2017-07-10 NOTE — Progress Notes (Signed)
PT IS BEING TRANSFER TO ICU BED 6 , INTUBATED FOR CONTINUE OF CARE , REPORT GIVEN TO ICU NURSE .

## 2017-07-10 NOTE — Progress Notes (Signed)
Pt currently on spont mode without complications.

## 2017-07-10 NOTE — Transfer of Care (Signed)
Immediate Anesthesia Transfer of Care Note  Patient: SAMMIE SCHERMERHORN  Procedure(s) Performed: Procedure(s): INSERTION PACEMAKER-DOUBLE CHAMBER (N/A)  Patient Location: PACU  Anesthesia Type:General  Level of Consciousness: sedated  Airway & Oxygen Therapy: Patient Spontanous Breathing and Patient connected to face mask oxygen  Post-op Assessment: Report given to RN and Post -op Vital signs reviewed and stable  Post vital signs: Reviewed and stable  Last Vitals:  Vitals:   07/03/2017 0909 06/23/2017 1154  BP: 140/60 139/70  Pulse: (!) 52 (!) 57  Resp: 18 20  Temp: 37.3 C 36.7 C    Last Pain:  Vitals:   07/09/2017 1207  TempSrc:   PainSc: 0-No pain         Complications: No apparent anesthesia complications

## 2017-07-10 NOTE — Op Note (Signed)
[facility]   07/16/2017 - 06/24/2017                     1:22 PM  PATIENT:  Tanya Wells    PRE-OPERATIVE DIAGNOSIS:  INTERMITTENT COMPLETE HEART BLOCK  POST-OPERATIVE DIAGNOSIS:  Same  PROCEDURE:  INSERTION PACEMAKER-DOUBLE CHAMBER  SURGEON:  Isaias Cowman, MD    ANESTHESIA:     PREOPERATIVE INDICATIONS:  Tanya Wells is a  71 y.o. female with a diagnosis of INTERMITTENT COMPLETE HEART BLOCK who failed conservative measures and elected for surgical management.    The risks benefits and alternatives were discussed with the patient preoperatively including but not limited to the risks of infection, bleeding, cardiopulmonary complications, the need for revision surgery, among others, and the patient was willing to proceed.   OPERATIVE PROCEDURE: The patient was brought to the operating room the fasting state. The left pectoral region was prepped and draped in the usual sterile manner. Anesthesia was obtained 1% lidocaine locally. A 6 cm incision was performed her left pectoral region. The pacemaker pocket was generated by electrocautery and blunt dissection. Access was obtained to left subclavian vein by fine needle aspiration. MRI compatible leads were positioned into the right ventricular apical septum and right atrial appendage under fluoroscopic guidance. After proper thresholds were obtained the leads were sutured in place. Pacemaker pocket was irrigated and months solution. The leads were connected to a MRI compatible dual-chamber rate responsive pacemaker generator (Medtronic Azure XT DR MRI Sure Scan [id number]). The pacemaker generator was positioned into the pocket and the pocket was closed with 2-0 and 4-0 Vicryl, respectively. Steri-Strips and pressure dressing were applied. There were no periprocedural complications.

## 2017-07-10 NOTE — Anesthesia Preprocedure Evaluation (Signed)
Anesthesia Evaluation  Patient identified by MRN, date of birth, ID band Patient awake    Reviewed: Allergy & Precautions, H&P , NPO status , Patient's Chart, lab work & pertinent test results, reviewed documented beta blocker date and time   Airway Mallampati: III  TM Distance: >3 FB Neck ROM: full    Dental no notable dental hx. (+) Poor Dentition, Missing   Pulmonary neg pulmonary ROS,    Pulmonary exam normal breath sounds clear to auscultation       Cardiovascular Exercise Tolerance: Good hypertension, negative cardio ROS   Rhythm:regular Rate:Normal     Neuro/Psych PSYCHIATRIC DISORDERS negative neurological ROS  negative psych ROS   GI/Hepatic negative GI ROS, Neg liver ROS, GERD  Medicated,  Endo/Other  negative endocrine ROSdiabetesHypothyroidism   Renal/GU      Musculoskeletal   Abdominal   Peds  Hematology negative hematology ROS (+) anemia ,   Anesthesia Other Findings   Reproductive/Obstetrics negative OB ROS                             Anesthesia Physical Anesthesia Plan  ASA: III  Anesthesia Plan: MAC   Post-op Pain Management:    Induction:   PONV Risk Score and Plan: 3 and Ondansetron, Dexamethasone, Propofol and Midazolam  Airway Management Planned:   Additional Equipment:   Intra-op Plan:   Post-operative Plan:   Informed Consent: I have reviewed the patients History and Physical, chart, labs and discussed the procedure including the risks, benefits and alternatives for the proposed anesthesia with the patient or authorized representative who has indicated his/her understanding and acceptance.     Plan Discussed with: CRNA  Anesthesia Plan Comments:         Anesthesia Quick Evaluation

## 2017-07-10 NOTE — Progress Notes (Signed)
Was call to pt room BY  ABBY CNA stated that she  Is not looking right , and was sweating with a cool and clammy skin CBG  Was check 143 but pt was unresponsive and having agonal breathing ,pt was ambu with bag mask and  nurse emergency was pull and [place] charge nurse came to the room and a code was call cpr was started followed by ACLS .

## 2017-07-10 NOTE — Progress Notes (Signed)
   Called to ICU urgently. Upon my arrival code was completed. Discussed case with Drs. End and Paraschos. Earlier in the afternoon, patient noted to be agonal post PPM. Patient was unresponsive with no palpable pulse. CPR was started for approximately 15-20 minutes. Reports of VF that transitioned to PEA prior to defibrillation. She was given 3 rounds of Epi with continued CPR. She was intubated. ROSC was obtained ~ 20 minutes into CPR. Patient had a blood pressure 092 systolic with a strong carotid pulse. Patient was then brought to the intensive care unit. Upon arrival to the intensive care unit patient had a weak carotid pulse and repeat blood pressure 55 systolic. Stat bedside echo apparently did not show pericardial effusion. Pressors briefly given to maintain MAP > 65. Repeat EKG showed sinus tachycardia. Telemetry showed initial pacing followed by some ectopy. Formal stat echo is pending. Troponin is pending. Stat CT head pending. No corneal or gag reflex. Bedside echo performed by Dr. Saunders Revel, with preliminary EF of 20%, possible stress-induced. Will need to rule out possible ischemic etiology vs PE vs acute neurological process. Supportive care for now. Repeat EKG pending now that heart rate has slowed down. MDT en route to interrogate the device. No emergent need for LHC at this time.

## 2017-07-10 NOTE — ED Provider Notes (Signed)
[facility]  Department of Emergency Medicine   Code Blue CONSULT NOTE  Chief Complaint: Cardiac arrest/unresponsive   Level V Caveat: Unresponsive  History of present illness: I was contacted by the hospital for a CODE BLUE cardiac arrest upstairs and presented to the patient's bedside.   Patient was found to be unresponsive with no palpable pulse pale in color and CPR was started. My brief history on the patient is that she was admitted for syncope had just had a pacemaker/defibrillator placed earlier today. Per nurse the patient stated she did not feel well was diaphoretic and pale followed by unresponsiveness with agonal breathing. No pulse was palpated and CPR was initiated.  ROS: Unable to obtain, Level V caveat  Scheduled Meds: . atorvastatin  20 mg Per Tube q1800  . dorzolamide-timolol  1 drop Both Eyes BID  . ipratropium-albuterol  3 mL Nebulization Q6H  . latanoprost  1 drop Both Eyes QHS  . levothyroxine  125 mcg Oral QAC breakfast   Continuous Infusions: .  ceFAZolin (ANCEF) IV    . lactated ringers Stopped (06/22/2017 1437)   PRN Meds:.acetaminophen **OR** acetaminophen, [DISCONTINUED] ondansetron **OR** ondansetron (ZOFRAN) IV Past Medical History:  Diagnosis Date  . Anemia   . Depression   . GERD (gastroesophageal reflux disease)   . Hyperlipidemia   . Hypothyroidism   . Liver cyst   . 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 Pulmonary hypertension ([place])    a. TTE 07/06/17: EF 60-65%, nl WM, nl diastolic fxn, mild MR, PASP 38  . Syncope    Past Surgical History:  Procedure Laterality Date  . ABDOMINAL HYSTERECTOMY     Social History   Social History  . Marital status: Married    Spouse name: N/A  . Number of children: N/A  . Years of education: N/A   Occupational History  . Not on file.   Social History Main Topics  . Smoking status: Never Smoker  . Smokeless tobacco: Never Used  . Alcohol use No  . Drug use: No  . Sexual  activity: No   Other Topics Concern  . Not on file   Social History Narrative  . No narrative on file   Allergies  Allergen Reactions  . Codeine Nausea Only    Last set of Vital Signs (not current) Vitals:   07/06/2017 1411 06/19/2017 1432  BP: (!) 147/65 (!) 149/88  Pulse: 60 60  Resp: 14 18  Temp: 98 F (36.7 C) 98.1 F (36.7 C)      Physical Exam  Gen: unresponsive Cardiovascular: pulseless  Resp: apneic. Breath sounds equal bilaterally with bagging  Abd: nondistended  Neuro: GCS 3, unresponsive to pain  HEENT: Positive for blood in posterior pharynx. No gag reflex. Neck: No obvious crepitus  Musculoskeletal: No obvious deformity  Skin: Warm but pale.  Procedures  INTUBATION Performed by: Harvest Dark Required items: required blood products, implants, devices, and special equipment available Patient identity confirmed: provided demographic data and hospital-assigned identification number Indications: Cardiac arrest  Intubation method: 4.0 Mac blade direct laryngoscopy Preoxygenation: 100% BVM Sedatives: 10 mg of etomidate  Paralytic: 80 mg of rocuronium  Tube Size: 7.5 cuffed Post-procedure assessment: chest rise and ETCO2 monitor Breath sounds: equal and absent over the epigastrium Tube secured by Respiratory Therapy Patient tolerated the procedure well with no immediate complications.  CRITICAL CARE Performed by: Harvest Dark Total critical care time: 40 minutes Critical care time was exclusive of separately billable procedures and  treating other patients. Critical care was necessary to treat or prevent imminent or life-threatening deterioration. Critical care was time spent personally by me on the following activities: development of treatment plan with patient and/or surrogate as well as nursing, discussions with consultants, evaluation of patient's response to treatment, examination of patient, obtaining history from patient or surrogate,  ordering and performing treatments and interventions, ordering and review of laboratory studies, ordering and review of radiographic studies, pulse oximetry and re-evaluation of patient's condition.  Cardiopulmonary Resuscitation (CPR) Procedure Note   Directed/Performed by: Harvest Dark I personally directed ancillary staff and/or performed CPR in an effort to regain return of spontaneous circulation and to maintain cardiac, neuro and systemic perfusion.    Medical Decision making   Patient presented to myself as a CODE BLUE unresponsive with no pulse GCS of 3. CPR was initiated prior to my arrival and continued upon my arrival. Approximately 15-20 minutes of CPR administered at one point the patient had ventricular fibrillation on the monitor however by the time the defibrillator was charged she had transitioned back to PEA, and no shock delivered. Patient received 3 rounds of epinephrine with continued CPR. Intubation performed by myself with equal breath sounds, no gastric sounds and good color change. Patient finally had return of spontaneous circulation after approximate 20 minutes of CPR (see code sheet for exact times). Patient had a blood pressure 552 systolic with a strong carotid pulse. Patient was then brought to the intensive care unit. Upon arrival to the intensive care unit patient had a weak carotid pulse and repeat blood pressure 55 systolic. I ordered norepinephrine verbally. EICU physician Dr.Simons began overseeing the patient and care was transitioned from myself to Dr. Jamal Collin.  Assessment and Plan  cardiac arrest    Harvest Dark, MD 07/02/2017 [phone]

## 2017-07-10 NOTE — Progress Notes (Signed)
Chaplain responded to Code blue and provided spiritual and emotional support to family.  Pts husband was present, along with three sisters and a great niece.  Family was appropriately tearful and vocalized how close their family was.  Family identifies as Psychologist, forensic and spirituality plays a large emphasis in their lives.  Chaplain provided a prayer and escorted family to ICU waiting area.       06/29/2017 1540  Clinical Encounter Type  Visited With Family  Visit Type Initial;Spiritual support;Code  Referral From Nurse  Spiritual Encounters  Spiritual Needs Prayer;Emotional   Rev. [place], MDiv. Staff Chaplain

## 2017-07-10 NOTE — Progress Notes (Signed)
Patient Name: Tanya Wells Date of Encounter: 07/13/2017  Primary Cardiologist: New to [facility] - consult by [facility] Problem List     Principal Problem:   Syncope Active Problems:   Carcinoid tumor   Orthostatic hypotension   Sinus pause   Tachycardia-bradycardia syndrome (HCC)   Elevated troponin     Subjective   No further syncopal episodes. No further pauses/blocks noted on telemetry. No chest pain or SOB. She is for PPM implantation today with Dr. Saralyn Pilar, MD.   Inpatient Medications    Scheduled Meds: . atorvastatin  20 mg Oral q1800  . dorzolamide-timolol  1 drop Both Eyes BID  . estradiol  0.5 mg Oral Daily  . FLUoxetine  20 mg Oral Daily  . ipratropium-albuterol  3 mL Nebulization Q6H  . latanoprost  1 drop Both Eyes QHS  . levothyroxine  125 mcg Oral QAC breakfast   Continuous Infusions:  PRN Meds: acetaminophen **OR** acetaminophen, HYDROcodone-acetaminophen, ondansetron **OR** ondansetron (ZOFRAN) IV, senna-docusate   Vital Signs    Vitals:   07/09/17 1934 07/09/17 2119 06/18/2017 0120 06/26/2017 0557  BP: (!) 144/74   (!) 150/83  Pulse: 76   68  Resp: 18   18  Temp: 99.3 F (37.4 C)   98.7 F (37.1 C)  TempSrc: Oral   Oral  SpO2: 95% 94% 97% 94%  Weight:      Height:        Intake/Output Summary (Last 24 hours) at 06/24/2017 0813 Last data filed at 06/29/2017 0154  Gross per 24 hour  Intake           756.25 ml  Output              300 ml  Net           456.25 ml   Filed Weights   07/05/2017 1026 06/21/2017 1630  Weight: 127 lb (57.6 kg) 132 lb 8 oz (60.1 kg)    Physical Exam    GEN: Well nourished, well developed, in no acute distress.  HEENT: Grossly normal.  Neck: Supple, no JVD, carotid bruits, or masses. Cardiac: RRR, no murmurs, rubs, or gallops. No clubbing, cyanosis, edema.  Radials/DP/PT 2+ and equal bilaterally.  Respiratory:  Respirations regular and unlabored, clear to auscultation bilaterally. GI: Soft, nontender,  nondistended, BS + x 4. MS: no deformity or atrophy. Skin: warm and dry, no rash. Neuro:  Strength and sensation are intact. Psych: AAOx3.  Normal affect.  Labs    CBC  Recent Labs  07/06/2017 1028 07/09/17 0518  WBC 9.9 6.2  HGB 11.5* 11.0*  HCT 34.2* 33.1*  MCV 87.8 87.1  PLT 144* 154*   Basic Metabolic Panel  Recent Labs  07/13/2017 1036 07/09/17 0518  NA 141 140  K 3.3* 4.4  CL 110 112*  CO2 24 22  GLUCOSE 170* 117*  BUN 8 8  CREATININE 0.84 0.77  CALCIUM 7.9* 8.0*  MG  --  1.9   Liver Function Tests  Recent Labs  07/15/2017 1036  AST 96*  ALT 82*  ALKPHOS 205*  BILITOT 1.0  PROT 5.2*  ALBUMIN 3.1*   No results for input(s): LIPASE, AMYLASE in the last 72 hours. Cardiac Enzymes  Recent Labs  07/05/2017 1608 07/13/2017 2240 07/09/17 0518  TROPONINI 0.04* 0.03* <0.03   BNP Invalid input(s): POCBNP D-Dimer No results for input(s): DDIMER in the last 72 hours. Hemoglobin A1C No results for input(s): HGBA1C in the last 72 hours. Fasting  Lipid Panel No results for input(s): CHOL, HDL, LDLCALC, TRIG, CHOLHDL, LDLDIRECT in the last 72 hours. Thyroid Function Tests  Recent Labs  07/09/17 0518  TSH 0.482    Telemetry    NSR, 60s bpm - Personally Reviewed  ECG    NSR - Personally Reviewed  Radiology    US Carotid Bilateral  Result Date: 07/09/2017 IMPRESSION: 1. Mild (1-49%) stenosis proximal right internal carotid artery secondary to focal heterogeneous atherosclerotic plaque. 2. No significant atherosclerotic plaque or evidence of stenosis in the left internal carotid artery. 3. Vertebral arteries are patent with antegrade flow. Signed, Criselda Peaches, MD [facility] Radiology Electronically Signed   By: Jacqulynn Cadet M.D.   On: 07/09/2017 18:23    Cardiac Studies   TTE 07/06/2017: Study Conclusions  - Left ventricle: The cavity size was normal. Wall thickness was normal.  Systolic function was normal. The estimated ejection fraction was in the range of 60% to 65%. Wall motion was normal; there were no regional wall motion abnormalities. Left ventricular diastolic function parameters were normal. - Mitral valve: There was mild regurgitation. - Pulmonary arteries: Systolic pressure was mildly increased. PA peak pressure: 38 mm Hg (S).  Patient Profile     71 y.o. female with history of metastatic carcinoid tumor of the stomach, followed by Dr. Grayland Ormond, MD, HLD, hypothyroidism, liver cysts, GERD and depression who presented to [facility] on 7/22 with syncope. Found to have intermittent 3rd degree AV block.   Assessment & Plan    1. Syncope/sinus pause/tachy-brady syndrome: -Likely multifactorial including tachy-brady syndrome, sinus pause, orthostasis and in the setting of known carcinoid tumor -Not on any rate limiting medications, including eye drops -For PPM implantation today given documented intermittent 3rd degree AV block on telemetry and tachy-brady syndrome -Per IM, there are no contraindications for PPM per oncology  -Potassium has been repleted -Monitor on telemetry -Carotid ultrasound with 1-49% stenosis of the RICA 2/2 focal heterogeneous atherosclerotic plaque. No evidence of atherosclerotic plaque in the LICA  2. Orthostatic hypotension: -IV fluids -Not on any antihypertensives at home  3. Elevated troponin: -Never with chest pain -Likely supply demand ischemia in the setting of hypotension with syncope/tachy-brady syndrome -Recent TTE as above -Consider outpatient Lexiscan Myoview to evaluate for high-risk ischemia  4. Hypokalemia: -Repleted -Add magnesium oxide 400 mg daily  4. Carcinoid tumor: -No contraindications for PPM per IM  5. Hypothyroidism: -TSH normal  Signed, Christell Faith, PA-C [facility] Pager: [phone] 07/04/2017, 8:13 AM

## 2017-07-10 NOTE — Progress Notes (Signed)
eLink Physician-Brief Progress Note Patient Name: Tanya Wells DOB: Sep 10, 1946 MRN: [id number]   Date of Service  07/11/2017  HPI/Events of Note  Pt transferred to ICU from floor after PEA/VF cardiac arrest, intubated, persistent hypotension. This after PPM palcement performed today  eICU Interventions  1) vent orders 2) sedation orders 3) NS bolus and vasopressors 4) Cardiology urgent consult 5) STAT echocardiogram 6) EKG, cardiac markers 7) Dr Mortimer Fries called to bedside 8) Consider hypothermia protocol     Intervention Category Evaluation Type: New Patient Evaluation  Wilhelmina Mcardle 07/06/2017, 4:37 PM

## 2017-07-10 NOTE — Procedures (Signed)
Central Venous Catheter Placement: Indication: Patient receiving vesicant or irritant drug.; Patient receiving intravenous therapy for longer than 5 days.; Patient has limited or no vascular access.   Consent:emergent    Hand washing performed prior to starting the procedure.   Procedure:   An active timeout was performed and correct patient, name, & ID confirmed.   Patient was positioned correctly for central venous access.  Patient was prepped using strict sterile technique including chlorohexadine preps, sterile drape, sterile gown and sterile gloves.    The area was prepped, draped and anesthetized in the usual sterile manner. Patient comfort was obtained.    A triple lumen catheter was placed in RT Femoral Vein There was good blood return, catheter caps were placed on lumens, catheter flushed easily, the line was secured and a sterile dressing and BIO-PATCH applied.   Ultrasound was used to visualize vasculature and guidance of needle.   Number of Attempts: 1 Complications:none Estimated Blood Loss: none Chest Radiograph indicated and ordered.  Operator: Nachmen Mansel.   Corrin Parker, M.D.  [facility]  Medical Director [place] Director [facility] Cardio-Pulmonary Department

## 2017-07-10 NOTE — Consult Note (Signed)
Name: Tanya Wells MRN: [id number] DOB: 1946-11-21     CONSULTATION DATE: 07/07/2017  REFERRING MD :  patel  CHIEF COMPLAINT:  Cardiac arrest  STUDIES:  Chest x-ray 06/19/2017 I have Independently reviewed images of    on 06/23/2017 Interpretation: No acute findings pacemaker in place    HISTORY OF PRESENT ILLNESS:  71 year old white female admitted for syncopal episode related to orthostatic hypotension Patient was found to have orthostasis and was given IV fluids Patient remains symptomatic throughout admission Upon further assessment patient was assessed for pacemaker placement and received pacemaker on 07/10/2017 in the morning Patient subsequently did not feel well and subsequently went into cardiac arrest Prior to cardiac arrest patient was diaphoretic and pale and then was unresponsive and with agonal respirations CPR and CODE BLUE was all then initiated for approximately 15 minutes ACLS protocol was started  Upon further review of the chart patient has metastatic gastric carcinoid  Patient currently intubatedsedation was briefly on vasopressors Patient remains on 100% FiO2 Central line placed in the right femoral vein emergently  PAST MEDICAL HISTORY :   has a past medical history of Anemia; Depression; GERD (gastroesophageal reflux disease); Hyperlipidemia; Hypothyroidism; Liver cyst; 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  has a past surgical history that includes Abdominal hysterectomy. Prior to Admission medications   Medication Sig Start Date End Date Taking? Authorizing Provider  atorvastatin (LIPITOR) 20 MG tablet Take 20 mg by mouth daily at 6 PM.  10/17/16 10/17/17 Yes [provider]  dorzolamide-timolol (COSOPT) 22.3-6.8 MG/ML ophthalmic solution Administer 1 drop to both eyes Two (2) times a day. 02/11/14  Yes [provider]  estradiol (ESTRACE) 0.5 MG tablet TAKE 1 TABLET EVERY DAY 11/07/16   Yes [provider]  FLUoxetine (PROZAC) 20 MG capsule Take 20 mg by mouth daily.  10/12/16  Yes [provider]  fluticasone (FLONASE) 50 MCG/ACT nasal spray SPRAY 2 SPRAYS IN EACH NOSTRIL DAILY 03/02/17  Yes [provider]  latanoprost (XALATAN) 0.005 % ophthalmic solution INSTILL ONE DROP TO BOTH EYES EVERY NIGHT 09/28/15  Yes [provider]  levothyroxine (SYNTHROID, LEVOTHROID) 125 MCG tablet Take 125 mcg by mouth daily before breakfast.  01/18/17  Yes [provider]   Allergies  Allergen Reactions  . Codeine Nausea Only    FAMILY HISTORY:  family history includes Diabetes in her brother and sister; Heart disease in her mother; Hypertension in her mother and sister; Skin cancer in her father. SOCIAL HISTORY:  reports that she has never smoked. She has never used smokeless tobacco. She reports that she does not drink alcohol or use drugs.  REVIEW OF SYSTEMS:   Unobtainable due to critical illness    VITAL SIGNS: Temp:  [97.8 F (36.6 C)-99.3 F (37.4 C)] 98.1 F (36.7 C) (07/24 1432) Pulse Rate:  [52-76] 60 (07/24 1432) Resp:  [11-20] 18 (07/24 1432) BP: (139-156)/(60-88) 149/88 (07/24 1432) SpO2:  [92 %-100 %] 100 % (07/24 1432) FiO2 (%):  [100 %] 100 % (07/24 1626) Weight:  [132 lb (59.9 kg)] 132 lb (59.9 kg) (07/24 1207)  Physical Examination:  GENERAL:critically ill appearing, +resp distress HEAD: Normocephalic, atraumatic.  EYES: Fixed and dilated pupils, no corneal reflex no gag reflex, .  MOUTH: Moist mucosal membrane. NECK: Supple. No thyromegaly. No nodules. No JVD.  PULMONARY: +rhonchi, +wheezing CARDIOVASCULAR: S1 and S2. Regular rate and rhythm. No murmurs, rubs, or gallops.  GASTROINTESTINAL: Soft, nontender, -distended. No masses. Positive bowel sounds.  No hepatosplenomegaly.  MUSCULOSKELETAL: No swelling, clubbing, or edema.  NEUROLOGIC: obtunded SKIN:intact,warm,dry        Recent Labs Lab  07/06/17 0418 07/15/2017 1036 07/09/17 0518  NA 140 141 140  K 4.0 3.3* 4.4  CL 109 110 112*  CO2 24 24 22   BUN 8 8 8   CREATININE 0.70 0.84 0.77  GLUCOSE 123* 170* 117*    Recent Labs Lab 07/11/2017 1028 07/09/17 0518 07/11/2017 1637  HGB 11.5* 11.0* 12.7  HCT 34.2* 33.1* 39.2  WBC 9.9 6.2 21.2*  PLT 144* 123* PENDING    ASSESSMENT / PLAN:  71 year old white female admitted to the ICU for acute cardiac arrest status post pacemaker placement at etiology is unclear at this time but need to rule out acute MI acute PE and acute stroke   #1 continue ventilatory support #2 continue vasopressor support as needed keep map greater than 65 #3 avoid all sedatives for now #4 follow-up lab tests Follow-up chest x-ray Will obtain CT of the head to rule out acute process Stat echocardiogram to rule out pericardial effusion  The reason for placing central line in the right femoral vein is to avoid's central line placements in the IJ's at this time in the setting of recent pacemaker placement    Critical Care Time devoted to patient care services described in this note is 55 minutes.   Overall, patient is critically ill, prognosis is guarded.  Patient with Multiorgan failure and at high risk for cardiac arrest and death.    Corrin Parker, M.D.  [facility]  Medical Director [place] Director [facility] Cardio-Pulmonary Department

## 2017-07-10 NOTE — Progress Notes (Signed)
Pt c/o wheezing and SOB.  Expiratory wheezes on left side noted on auscultation. MD Weiting made aware. IV fluids stopped, Duoneb and20 mg iv lasix administered per MD orders. Pt states she feels much better after breathing treatment. Will continue to monitor.

## 2017-07-10 NOTE — Anesthesia Procedure Notes (Signed)
Date/Time: 06/23/2017 12:41 PM Performed by: Nelda Marseille Pre-anesthesia Checklist: Patient identified, Emergency Drugs available, Suction available, Patient being monitored and Timeout performed Oxygen Delivery Method: Simple face mask

## 2017-07-11 ENCOUNTER — Encounter: Payer: Self-pay | Admitting: Certified Registered"

## 2017-07-11 ENCOUNTER — Inpatient Hospital Stay: Payer: Medicare Other

## 2017-07-11 ENCOUNTER — Encounter: Payer: Self-pay | Admitting: Cardiology

## 2017-07-11 DIAGNOSIS — J9691 Respiratory failure, unspecified with hypoxia: Secondary | ICD-10-CM

## 2017-07-11 DIAGNOSIS — R748 Abnormal levels of other serum enzymes: Secondary | ICD-10-CM

## 2017-07-11 DIAGNOSIS — Z950 Presence of cardiac pacemaker: Secondary | ICD-10-CM

## 2017-07-11 LAB — ECHOCARDIOGRAM COMPLETE
Height: 59 in
WEIGHTICAEL: 2112 [oz_av]

## 2017-07-11 LAB — TROPONIN I: Troponin I: 0.68 ng/mL (ref <0.03)

## 2017-07-11 MED ORDER — ENOXAPARIN SODIUM 40 MG/0.4ML ~~LOC~~ SOLN
40.0000 mg | SUBCUTANEOUS | Status: DC
  Administered 2017-07-11 – 2017-07-13 (×3): 40 mg via SUBCUTANEOUS
  Filled 2017-07-11 (×3): qty 0.4

## 2017-07-11 MED ORDER — STERILE WATER FOR INJECTION IJ SOLN
INTRAMUSCULAR | Status: AC
  Administered 2017-07-11: 1 mL
  Filled 2017-07-11: qty 10

## 2017-07-11 MED ORDER — SODIUM CHLORIDE 0.45 % IV SOLN
INTRAVENOUS | Status: DC
  Administered 2017-07-11 (×2): via INTRAVENOUS

## 2017-07-11 MED ORDER — CEFAZOLIN SODIUM-DEXTROSE 2-4 GM/100ML-% IV SOLN
2.0000 g | INTRAVENOUS | Status: AC
  Filled 2017-07-11: qty 100

## 2017-07-11 MED ORDER — POTASSIUM CHLORIDE 10 MEQ/100ML IV SOLN
10.0000 meq | INTRAVENOUS | Status: AC
  Administered 2017-07-11 (×4): 10 meq via INTRAVENOUS
  Filled 2017-07-11 (×4): qty 100

## 2017-07-11 MED ORDER — PHENTOLAMINE MESYLATE 5 MG IJ SOLR
5.0000 mg | Freq: Once | INTRAMUSCULAR | Status: AC
  Administered 2017-07-11: 5 mg via SUBCUTANEOUS
  Filled 2017-07-11: qty 5

## 2017-07-11 MED FILL — Medication: Qty: 1 | Status: AC

## 2017-07-11 NOTE — Progress Notes (Signed)
Progress Note  Patient Name: Tanya Wells Date of Encounter: 07/11/2017  Primary Cardiologist: End  Subjective   Extubated this morning 6 AM,  no significant arrhythmia overnight on review of telemetry person by myself Has not had any food, Denies any significant chest pain  Inpatient Medications    Scheduled Meds: . atorvastatin  20 mg Per Tube q1800  . chlorhexidine gluconate (MEDLINE KIT)  15 mL Mouth Rinse BID  . chlorhexidine gluconate (MEDLINE KIT)  15 mL Mouth Rinse BID  . dorzolamide-timolol  1 drop Both Eyes BID  . enoxaparin (LOVENOX) injection  40 mg Subcutaneous Q24H  . ipratropium-albuterol  3 mL Nebulization Q6H  . latanoprost  1 drop Both Eyes QHS  . levothyroxine  125 mcg Oral QAC breakfast  . mouth rinse  15 mL Mouth Rinse 10 times per day   Continuous Infusions: . sodium chloride 75 mL/hr at 07/11/17 1331  . famotidine (PEPCID) IV Stopped (07/11/17 1001)   PRN Meds: acetaminophen **OR** acetaminophen, bisacodyl, docusate, fentaNYL (SUBLIMAZE) injection, fentaNYL (SUBLIMAZE) injection, [DISCONTINUED] ondansetron **OR** ondansetron (ZOFRAN) IV   Vital Signs    Vitals:   07/11/17 1348 07/11/17 1400 07/11/17 1500 07/11/17 1530  BP:  109/70 98/70 95/66   Pulse: 79 73 70 67  Resp: 16 17 19 20   Temp: 98.8 F (37.1 C)   97.6 F (36.4 C)  TempSrc: Axillary   Oral  SpO2: 94%   98%  Weight:      Height:        Intake/Output Summary (Last 24 hours) at 07/11/17 1814 Last data filed at 07/11/17 1500  Gross per 24 hour  Intake          1784.58 ml  Output             2270 ml  Net          -485.42 ml   Filed Weights   06/27/2017 1026 07/11/2017 1630 06/30/2017 1207  Weight: 127 lb (57.6 kg) 132 lb 8 oz (60.1 kg) 132 lb (59.9 kg)    Telemetry    Normal sinus rhythm, no significant arrhythmia - Personally Reviewed  ECG      Physical Exam   GEN: No acute distress.  Now extubated Neck: No JVD Cardiac: RRR, no murmurs, rubs, or gallops.    Respiratory: Clear to auscultation bilaterally. GI: Soft, nontender, non-distended  MS: No edema; No deformity. Neuro:  Nonfocal  Psych: Normal affect , communicative, answering questions  Labs    Chemistry Recent Labs Lab 06/23/2017 1036 07/09/17 0518 06/25/2017 1700  NA 141 140 156*  K 3.3* 4.4 3.1*  CL 110 112* 97*  CO2 24 22 45*  GLUCOSE 170* 117* 209*  BUN 8 8 9   CREATININE 0.84 0.77 0.90  CALCIUM 7.9* 8.0* 6.5*  PROT 5.2*  --  3.5*  ALBUMIN 3.1*  --  2.0*  AST 96*  --  145*  ALT 82*  --  113*  ALKPHOS 205*  --  141*  BILITOT 1.0  --  1.1  GFRNONAA >60 >60 >60  GFRAA >60 >60 >60  ANIONGAP 7 6 14      Hematology Recent Labs Lab 06/20/2017 1028 07/09/17 0518 06/24/2017 1637  WBC 9.9 6.2 21.2*  RBC 3.90 3.79* 4.35  HGB 11.5* 11.0* 12.7  HCT 34.2* 33.1* 39.2  MCV 87.8 87.1 90.3  MCH 29.4 29.0 29.3  MCHC 33.5 33.2 32.4  RDW 15.3* 15.4* 15.8*  PLT 144* 123* 127*    Cardiac Enzymes  Recent Labs Lab 07/09/17 0518 06/22/2017 1700 06/23/2017 2229 07/11/17 0806  TROPONINI <0.03 0.80* 0.91* 0.68*   No results for input(s): TROPIPOC in the last 168 hours.   BNPNo results for input(s): BNP, PROBNP in the last 168 hours.   DDimer No results for input(s): DDIMER in the last 168 hours.   Radiology    Ct Head Wo Contrast  Result Date: 06/27/2017 CLINICAL DATA:  71 year old female post code blue. Down for 15 minutes. Initial encounter. EXAM: CT HEAD WITHOUT CONTRAST TECHNIQUE: Contiguous axial images were obtained from the base of the skull through the vertex without intravenous contrast. COMPARISON:  07/05/2017. FINDINGS: Brain: No intracranial hemorrhage or CT evidence of large acute infarct. Currently no definitive evidence of global hypoxia. This may present in a delayed fashion and possibility can be evaluated on follow-up depending on the patient's clinical status. Mild global atrophy. No intracranial mass lesion noted on this unenhanced exam. Vascular: Vascular  calcification Skull: No acute abnormality Sinuses/Orbits: No acute orbital abnormality. Visualized lungs clear. Other: Mastoid air cells and middle ear cavities clear. IMPRESSION: Currently no CT evidence to suggest global hypoxia. If the patient has clinical findings to suggest such, this could be further evaluated on follow-up CT as CT evidence of hypoxia may present in a delayed fashion. No intracranial hemorrhage or CT evidence of large acute infarct. Electronically Signed   By: Genia Del M.D.   On: 06/26/2017 19:10   Ct Angio Chest Pe W Or Wo Contrast  Result Date: 07/07/2017 CLINICAL DATA:  Post code after pacemaker EXAM: CT ANGIOGRAPHY CHEST WITH CONTRAST TECHNIQUE: Multidetector CT imaging of the chest was performed using the standard protocol during bolus administration of intravenous contrast. Multiplanar CT image reconstructions and MIPs were obtained to evaluate the vascular anatomy. CONTRAST:  75 mL Isovue 370 intravenous COMPARISON:  Radiograph 07/09/2017, CT chest 07/05/2017, PET-CT 04/03/2017 FINDINGS: Cardiovascular: Satisfactory opacification of the pulmonary arteries to the segmental level. No evidence of pulmonary embolism. Non aneurysmal aorta. Atherosclerosis is present. Coronary artery calcification. Small amount of iatrogenic in the pulmonary trunk. Left-sided cardiac pacing device with leads in the right atrium and right ventricle. Borderline cardiomegaly. Small pericardial effusion. Mediastinum/Nodes: No thyroid mass. Endotracheal tube tip terminates about a cm above the carina. Nonspecific mediastinal lymph nodes, slightly increased compared to prior study. Esophagus unremarkable Lungs/Pleura: Small to moderate bilateral pleural effusions. Dense consolidation within the bilateral lower lobes. Ground-glass density within the right upper lobe with patchy ground-glass density in the right middle lobe. 4 mm left upper lobe pulmonary nodule, series 7, image number 23. Oval nodular  density subpleural left upper lobe series 7, image number 32. No pneumothorax. Upper Abdomen: Known hepatic metastatic disease is not well visualized on today's study due to timing of contrast bolus. Musculoskeletal: No acute or suspicious bone lesions are seen. Review of the MIP images confirms the above findings. IMPRESSION: 1. Negative for acute pulmonary embolus 2. Small to moderate bilateral pleural effusions with dense bilateral lower lobe consolidations which may reflect atelectasis, pneumonia or aspiration. Ground-glass density in the right upper and middle lobes may reflect asymmetric edema or possibly infection. 3. 4 mm left upper lobe pulmonary nodule, probably infectious or inflammatory given time course of development. 4. Known hepatic metastatic disease not well appreciated on the current study. Aortic Atherosclerosis (ICD10-I70.0). Electronically Signed   By: Donavan Foil M.D.   On: 06/30/2017 19:23   Dg Chest Port 1 View  Result Date: 07/11/2017 CLINICAL DATA:  Respiratory failure EXAM: PORTABLE  CHEST 1 VIEW COMPARISON:  06/29/2017 FINDINGS: Endotracheal tube remains satisfactorily positioned, 2.6 cm above the carina. Grossly intact transvenous cardiac leads. Probable pleural effusions. Patchy central airspace opacity in the right lung, appearing partially cleared compared to 06/23/2017. IMPRESSION: 1.  Support equipment appears satisfactorily positioned. 2. Partial clearance of central right lung airspace opacity. Persistent pleural effusions. Electronically Signed   By: Andreas Newport M.D.   On: 07/11/2017 05:29   Dg Chest Port 1 View  Result Date: 06/17/2017 CLINICAL DATA:  line placement EXAM: PORTABLE CHEST 1 VIEW COMPARISON:  07/17/2017 FINDINGS: Endotracheal tube tip is approximately 2.6 cm superior to the carina. Left-sided duo lead pacing device as before. Interim development of diffuse ground-glass density throughout the right thorax. Linear scarring on the left. Small right  effusion. Normal heart size. No pneumothorax. No definite additional ascending or descending catheters are seen. IMPRESSION: 1. Endotracheal tube tip about 2.6 cm superior to carina 2. Tiny right pleural effusion. Development of diffuse ground-glass density in the right thorax, findings could be secondary to asymmetric edema, diffuse infection, or hemorrhage. Electronically Signed   By: Donavan Foil M.D.   On: 06/29/2017 17:38   Dg Chest Port 1 View  Result Date: 06/29/2017 CLINICAL DATA:  Post pacemaker insertion, history of carcinoid tumor of the stomach EXAM: PORTABLE CHEST 1 VIEW COMPARISON:  CT chest of 07/05/2017 and chest x-ray of the same day FINDINGS: The heart is borderline enlarged and there is some fullness of the perihilar vasculature suggesting mild fluid overload. Tiny pleural effusions cannot be excluded. Permanent pacemaker is now present. No bony abnormality is seen. IMPRESSION: 1. Question mild pulmonary vascular congestion. 2. Cannot exclude small pleural effusions. 3. Permanent pacemaker is present. Electronically Signed   By: Ivar Drape M.D.   On: 06/29/2017 13:53   Dg C-arm 1-60 Min-no Report  Result Date: 07/04/2017 Fluoroscopy was utilized by the requesting physician.  No radiographic interpretation.    Cardiac Studies   Echocardiogram showing ejection fraction 25-30% Moderate LVH, no pericardial effusion   Patient Profile     71 y.o. female admitted to the hospital for pacemaker placement, diagnosis of intermittent complete heart block. Pacer placed 06/24/2017 1 PM  Postoperative complications, Noted to be having agonal breathing, unresponsive with no palpable pulse.  CPR was started for approximately 15-20 minutes. Reports of VF that transitioned to PEA prior to defibrillation. She was given 3 rounds of Epi with continued CPR. She was intubated. ROSC was obtained ~ 20 minutes into CPR.   Stat bedside echo  did not show pericardial effusion.  Pressors briefly given  to maintain MAP > 65. Telemetry showed initial pacing followed by some ectopy.  Heart rate up to 155 bpm after epinephrine given Head CT scan unrevealing   preliminary EF of 20%, possible stress-induced.  Extubated this morning, Given events from above pacer lead has dislodged    Assessment & Plan    ---Cardio respiratory arrest after pacemaker placement CPR, ACLS, now extubated Peak troponin 0.9 now trending down 0.68, asymptomatic Potassium 3.1 Etiology of the events from yesterday is still unclear, unable to exclude complication from her carcinoid Severely depressed ejection fraction yesterday. Would recommend follow-up echocardiogram in several weeks after medical management. Suspect stress-induced  --- Pacemaker Lead has dislodged, discussed case with Dr. Vito Berger encourage patient to have lead revision tomorrow We'll make NPO tomorrow morning  ---Malignant carcinoid tumor of the stomach  Symptoms of orthostasis and flushing Unclear if this played a role in her  advanced yesterday Receiving subcutaneous shots/chemotherapy  Long discussion with patient concerning events from yesterday  Total encounter time more than 35 minutes  Greater than 50% was spent in counseling and coordination of care with the patient   Signed, Ida Rogue, MD  07/11/2017, 6:14 PM

## 2017-07-11 NOTE — Evaluation (Signed)
Clinical/Bedside Swallow Evaluation Patient Details  Name: Tanya Wells MRN: [id number] Date of Birth: January 03, 1946  Today's Date: 07/11/2017 Time: SLP Start Time (ACUTE ONLY): 1025 SLP Stop Time (ACUTE ONLY): 1104 SLP Time Calculation (min) (ACUTE ONLY): 39 min  Past Medical History:  Past Medical History:  Diagnosis Date  . Anemia   . Depression   . GERD (gastroesophageal reflux disease)   . Hyperlipidemia   . Hypothyroidism   . Liver cyst   . Malignant carcinoid tumor of stomach ([place])   . Pulmonary hypertension ([place])    a. TTE 07/06/17: EF 60-65%, nl WM, nl diastolic fxn, mild MR, PASP 38  . Syncope    Past Surgical History:  Past Surgical History:  Procedure Laterality Date  . ABDOMINAL HYSTERECTOMY    . PACEMAKER INSERTION N/A 07/02/2017   Procedure: INSERTION PACEMAKER-DOUBLE CHAMBER;  Surgeon: Isaias Cowman, MD;  Location: [facility] ORS;  Service: Cardiovascular;  Laterality: N/A;   HPI:      Assessment / Plan / Recommendation Clinical Impression  pt presents with a min to mild aspiration risk as characterized by cough x 1 with regular texture dry bolus. pt had no overt ssx aspiration with any other textures tested. pt had adequate dentition for intake and timely a-p transit with all textures. pt stated she was very thirsty. pt was cued to decrease rate and amount taken via straw and able to follow instructions. slp recommends continued soft with thin diet. NSG verbally agreed.  SLP Visit Diagnosis: Dysphagia, oropharyngeal phase (R13.12)    Aspiration Risk  Mild aspiration risk    Diet Recommendation Dysphagia 3 (Mech soft);Thin liquid   Liquid Administration via: Cup;Straw Medication Administration: Whole meds with puree Supervision: Patient able to self feed Compensations: Slow rate;Small sips/bites;Follow solids with liquid Postural Changes: Seated upright at 90 degrees    Other  Recommendations Oral Care Recommendations: Patient independent with oral care    Follow up Recommendations        Frequency and Duration min 3x week  1 week       Prognosis Prognosis for Safe Diet Advancement: Good      Swallow Study   General Date of Onset: 07/11/2017 Type of Study: Bedside Swallow Evaluation Diet Prior to this Study: Dysphagia 3 (soft);Thin liquids Temperature Spikes Noted: N/A Respiratory Status: Nasal cannula History of Recent Intubation: Yes Date extubated: 06/19/2017 Behavior/Cognition: Alert;Cooperative;Pleasant mood Oral Cavity Assessment: Within Functional Limits Oral Care Completed by SLP: No Oral Cavity - Dentition: Missing dentition Vision: Functional for self-feeding Self-Feeding Abilities: Able to feed self Patient Positioning: Upright in bed Baseline Vocal Quality: Normal Volitional Cough: Strong Volitional Swallow: Able to elicit    Oral/Motor/Sensory Function Overall Oral Motor/Sensory Function: Within functional limits   Ice Chips Ice chips: Within functional limits Presentation: Spoon;Cup   Thin Liquid Thin Liquid: Within functional limits Presentation: Cup;Spoon;Straw    Nectar Thick Nectar Thick Liquid: Not tested   Honey Thick Honey Thick Liquid: Not tested   Puree Puree: Within functional limits Presentation: Self Fed;Spoon   Solid   GO   Solid: Within functional limits Presentation: Self Fed;Spoon Other Comments: pt did have cough x 1 with dry regular textures        Loreley Schwall Harris Sauber 07/11/2017,11:28 AM

## 2017-07-11 NOTE — Progress Notes (Signed)
   Name: Tanya Wells MRN: [id number] DOB: 1946-11-03     CONSULTATION DATE: 06/26/2017   Synopsis: 71 year old white female with metastatic gastric carcinoid admitted for syncopal episode related to orthostatic hypotension--received pacemaker on 06/22/2017 in the morning Patient subsequently did not feel well and subsequently went into cardiac arrest Prior to cardiac arrest patient was diaphoretic and pale and then was unresponsive and with agonal respirations CPR and CODE BLUE was all then initiated for approximately 15 minutes ACLS protocol was started  Overnight, the patient woke up, her mental status improved, she was successfully extubated.   Subjective:  Patient is feeling well this morning, though she remains very sleepy/lethargic. she has no particular complaints.  REVIEW OF SYSTEMS:   Unobtainable due to critical illness   VITAL SIGNS: Temp:  [97.5 F (36.4 C)-99.1 F (37.3 C)] 97.6 F (36.4 C) (07/25 0800) Pulse Rate:  [52-110] 81 (07/25 0800) Resp:  [7-37] 25 (07/25 0800) BP: (92-156)/(60-113) 114/80 (07/25 0800) SpO2:  [91 %-100 %] 96 % (07/25 0800) FiO2 (%):  [35 %-100 %] 35 % (07/25 0400) Weight:  [132 lb (59.9 kg)] 132 lb (59.9 kg) (07/24 1207)  Physical Examination:  GENERAL:critically ill appearing, no resp distress HEAD: Normocephalic, atraumatic.  EYES: Pupils equal, round, react to light and accommodation. MOUTH: Moist mucosal membrane. NECK: Supple. No thyromegaly. No nodules. No JVD.  PULMONARY: +rhonchi, +wheezing CARDIOVASCULAR: S1 and S2. Regular rate and rhythm. No murmurs, rubs, or gallops.  GASTROINTESTINAL: Soft, nontender, -distended. No masses. Positive bowel sounds. No hepatosplenomegaly.  MUSCULOSKELETAL: No swelling, clubbing, or edema.  NEUROLOGIC: obtunded SKIN:intact,warm,dry        Recent Labs Lab 06/17/2017 1036 07/09/17 0518 07/08/2017 1700  NA 141 140 156*  K 3.3* 4.4 3.1*  CL 110 112* 97*  CO2 24 22 45*  BUN 8 8 9     CREATININE 0.84 0.77 0.90  GLUCOSE 170* 117* 209*    Recent Labs Lab 06/21/2017 1028 07/09/17 0518 06/27/2017 1637  HGB 11.5* 11.0* 12.7  HCT 34.2* 33.1* 39.2  WBC 9.9 6.2 21.2*  PLT 144* 123* 127*    ASSESSMENT / PLAN:  71 year old white female admitted to the ICU for acute cardiac arrest status post pacemaker placement . Extubated 7/25. Mild hypernatremia, and hypo-kalemia.  continue to monitor. Wean down/off pressors.  Continue to avoid all sedatives for now We will follow-up lab tests, echocardiogram is pending.  Marda Stalker, MD.   Board Certified in Internal Medicine, Pulmonary Medicine, [place], and Sleep Medicine.  [facility] Office Number: [phone] Pager: [phone]  Patricia Pesa, M.D.  Merton Border, M.D

## 2017-07-11 NOTE — Anesthesia Postprocedure Evaluation (Signed)
Anesthesia Post Note  Patient: Tanya Wells  Procedure(s) Performed: Procedure(s) (LRB): INSERTION PACEMAKER-DOUBLE CHAMBER (N/A)  Patient location during evaluation: ICU Anesthesia Type: MAC Level of consciousness: sedated Pain management: pain level controlled Vital Signs Assessment: post-procedure vital signs reviewed and stable Respiratory status: patient remains intubated per anesthesia plan Cardiovascular status: stable Anesthetic complications: no     Last Vitals:  Vitals:   07/11/17 0400 07/11/17 0600  BP: 109/77 119/83  Pulse: 80 81  Resp: 19 17  Temp: 37 C     Last Pain:  Vitals:   07/11/17 0400  TempSrc: Oral  PainSc:                  Alison Stalling

## 2017-07-11 NOTE — Progress Notes (Signed)
[facility] [place] NAME: Tanya Wells    MR#:  [id number]  DATE OF BIRTH:  11-10-1946  SUBJECTIVE:  Pt got extuabted. Doing ok. Husband at bedside REVIEW OF SYSTEMS:   Review of Systems  Constitutional: Negative for chills, fever and weight loss.  HENT: Negative for ear discharge, ear pain and nosebleeds.   Eyes: Negative for blurred vision, pain and discharge.  Respiratory: Negative for sputum production, shortness of breath, wheezing and stridor.   Cardiovascular: Negative for chest pain, palpitations, orthopnea and PND.  Gastrointestinal: Negative for abdominal pain, diarrhea, nausea and vomiting.  Genitourinary: Negative for frequency and urgency.  Musculoskeletal: Negative for back pain and joint pain.  Neurological: Negative for sensory change, speech change, focal weakness and weakness.  Psychiatric/Behavioral: Negative for depression and hallucinations. The patient is not nervous/anxious.    Tolerating Diet:yes Tolerating PT: pending  DRUG ALLERGIES:   Allergies  Allergen Reactions  . Codeine Nausea Only    VITALS:  Blood pressure 95/66, pulse 67, temperature 97.6 F (36.4 C), temperature source Oral, resp. rate 20, height 4\' 11"  (1.499 m), weight 59.9 kg (132 lb), SpO2 98 %.  PHYSICAL EXAMINATION:   Physical Exam  GENERAL:  71 y.o.-year-old patient lying in the bed with no acute distress.  EYES: Pupils equal, round, reactive to light and accommodation. No scleral icterus. Extraocular muscles intact.  HEENT: Head atraumatic, normocephalic. Oropharynx and nasopharynx clear.  NECK:  Supple, no jugular venous distention. No thyroid enlargement, no tenderness.  LUNGS: Normal breath sounds bilaterally, no wheezing, rales, rhonchi. No use of accessory muscles of respiration. Left upper chest wall dressing for pacemaker present CARDIOVASCULAR: S1, S2 normal. No murmurs, rubs, or gallops.  ABDOMEN: Soft, nontender,  nondistended. Bowel sounds present. No organomegaly or mass.  EXTREMITIES: No cyanosis, clubbing or edema b/l.    NEUROLOGIC: Cranial nerves II through XII are intact. No focal Motor or sensory deficits b/l.   PSYCHIATRIC:  patient is alert and oriented x 3.  SKIN: No obvious rash, lesion, or ulcer.   LABORATORY PANEL:  CBC  Recent Labs Lab 07/17/2017 1637  WBC 21.2*  HGB 12.7  HCT 39.2  PLT 127*    Chemistries   Recent Labs Lab 07/09/17 0518 07/08/2017 1700  NA 140 156*  K 4.4 3.1*  CL 112* 97*  CO2 22 45*  GLUCOSE 117* 209*  BUN 8 9  CREATININE 0.77 0.90  CALCIUM 8.0* 6.5*  MG 1.9  --   AST  --  145*  ALT  --  113*  ALKPHOS  --  141*  BILITOT  --  1.1   Cardiac Enzymes  Recent Labs Lab 07/11/17 0806  TROPONINI 0.68*   RADIOLOGY:  Ct Head Wo Contrast  Result Date: 06/21/2017 CLINICAL DATA:  71 year old female post code blue. Down for 15 minutes. Initial encounter. EXAM: CT HEAD WITHOUT CONTRAST TECHNIQUE: Contiguous axial images were obtained from the base of the skull through the vertex without intravenous contrast. COMPARISON:  07/05/2017. FINDINGS: Brain: No intracranial hemorrhage or CT evidence of large acute infarct. Currently no definitive evidence of global hypoxia. This may present in a delayed fashion and possibility can be evaluated on follow-up depending on the patient's clinical status. Mild global atrophy. No intracranial mass lesion noted on this unenhanced exam. Vascular: Vascular calcification Skull: No acute abnormality Sinuses/Orbits: No acute orbital abnormality. Visualized lungs clear. Other: Mastoid air cells and middle ear cavities clear. IMPRESSION: Currently no CT evidence to  suggest global hypoxia. If the patient has clinical findings to suggest such, this could be further evaluated on follow-up CT as CT evidence of hypoxia may present in a delayed fashion. No intracranial hemorrhage or CT evidence of large acute infarct. Electronically Signed    By: Genia Del M.D.   On: 07/02/2017 19:10   Ct Angio Chest Pe W Or Wo Contrast  Result Date: 06/18/2017 CLINICAL DATA:  Post code after pacemaker EXAM: CT ANGIOGRAPHY CHEST WITH CONTRAST TECHNIQUE: Multidetector CT imaging of the chest was performed using the standard protocol during bolus administration of intravenous contrast. Multiplanar CT image reconstructions and MIPs were obtained to evaluate the vascular anatomy. CONTRAST:  75 mL Isovue 370 intravenous COMPARISON:  Radiograph 07/16/2017, CT chest 07/05/2017, PET-CT 04/03/2017 FINDINGS: Cardiovascular: Satisfactory opacification of the pulmonary arteries to the segmental level. No evidence of pulmonary embolism. Non aneurysmal aorta. Atherosclerosis is present. Coronary artery calcification. Small amount of iatrogenic in the pulmonary trunk. Left-sided cardiac pacing device with leads in the right atrium and right ventricle. Borderline cardiomegaly. Small pericardial effusion. Mediastinum/Nodes: No thyroid mass. Endotracheal tube tip terminates about a cm above the carina. Nonspecific mediastinal lymph nodes, slightly increased compared to prior study. Esophagus unremarkable Lungs/Pleura: Small to moderate bilateral pleural effusions. Dense consolidation within the bilateral lower lobes. Ground-glass density within the right upper lobe with patchy ground-glass density in the right middle lobe. 4 mm left upper lobe pulmonary nodule, series 7, image number 23. Oval nodular density subpleural left upper lobe series 7, image number 32. No pneumothorax. Upper Abdomen: Known hepatic metastatic disease is not well visualized on today's study due to timing of contrast bolus. Musculoskeletal: No acute or suspicious bone lesions are seen. Review of the MIP images confirms the above findings. IMPRESSION: 1. Negative for acute pulmonary embolus 2. Small to moderate bilateral pleural effusions with dense bilateral lower lobe consolidations which may reflect  atelectasis, pneumonia or aspiration. Ground-glass density in the right upper and middle lobes may reflect asymmetric edema or possibly infection. 3. 4 mm left upper lobe pulmonary nodule, probably infectious or inflammatory given time course of development. 4. Known hepatic metastatic disease not well appreciated on the current study. Aortic Atherosclerosis (ICD10-I70.0). Electronically Signed   By: Donavan Foil M.D.   On: 07/07/2017 19:23   Dg Chest Port 1 View  Result Date: 07/11/2017 CLINICAL DATA:  Respiratory failure EXAM: PORTABLE CHEST 1 VIEW COMPARISON:  06/26/2017 FINDINGS: Endotracheal tube remains satisfactorily positioned, 2.6 cm above the carina. Grossly intact transvenous cardiac leads. Probable pleural effusions. Patchy central airspace opacity in the right lung, appearing partially cleared compared to 07/09/2017. IMPRESSION: 1.  Support equipment appears satisfactorily positioned. 2. Partial clearance of central right lung airspace opacity. Persistent pleural effusions. Electronically Signed   By: Andreas Newport M.D.   On: 07/11/2017 05:29   Dg Chest Port 1 View  Result Date: 06/26/2017 CLINICAL DATA:  line placement EXAM: PORTABLE CHEST 1 VIEW COMPARISON:  07/12/2017 FINDINGS: Endotracheal tube tip is approximately 2.6 cm superior to the carina. Left-sided duo lead pacing device as before. Interim development of diffuse ground-glass density throughout the right thorax. Linear scarring on the left. Small right effusion. Normal heart size. No pneumothorax. No definite additional ascending or descending catheters are seen. IMPRESSION: 1. Endotracheal tube tip about 2.6 cm superior to carina 2. Tiny right pleural effusion. Development of diffuse ground-glass density in the right thorax, findings could be secondary to asymmetric edema, diffuse infection, or hemorrhage. Electronically Signed   By: Maudie Mercury  Francoise Ceo M.D.   On: 07/09/2017 17:38   Dg Chest Port 1 View  Result Date:  07/07/2017 CLINICAL DATA:  Post pacemaker insertion, history of carcinoid tumor of the stomach EXAM: PORTABLE CHEST 1 VIEW COMPARISON:  CT chest of 07/05/2017 and chest x-ray of the same day FINDINGS: The heart is borderline enlarged and there is some fullness of the perihilar vasculature suggesting mild fluid overload. Tiny pleural effusions cannot be excluded. Permanent pacemaker is now present. No bony abnormality is seen. IMPRESSION: 1. Question mild pulmonary vascular congestion. 2. Cannot exclude small pleural effusions. 3. Permanent pacemaker is present. Electronically Signed   By: Ivar Drape M.D.   On: 07/07/2017 13:53   Dg C-arm 1-60 Min-no Report  Result Date: 06/18/2017 Fluoroscopy was utilized by the requesting physician.  No radiographic interpretation.   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  1. Cardiorespiratory arrest after PM placement -pt underwent CPR and ACLS protocol with intubation---now extubated -HR and BP stable  2. Syncope with significant orthostasis: Cardiac evaluation. Patient's telemetry rhythm strip were reviewed and she had bradycardia down into the 50s and had tachycardia into the 130s. She had rhythm strip that showed pause for 7 sec vs heart block. Pacemaker is recommended by Dr. Fletcher Anon  -Patient is status post pacemaker placement by Dr. 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 Malignant carcinoid tumor of the stomach causing orthostasis and flushing Spoke with Dr. Grayland Ormond. Patient currently is getting subcutaneous shots for chemotherapy. She is due for a shot however since the chemotherapy medication as outpatient will reschedule it at a later date.   4.HLD: Continue statin  5. Hypothyroidism: Continue Synthroid  6.  Depression: Continue Prozac   Discussed with husband.  Case discussed with Care Management/Social Worker. Management plans discussed with the patient, family and they are in agreement.  CODE STATUS: full  DVT Prophylaxis: lovenox  TOTAL TIME TAKING CARE OF THIS PATIENT: 25 minutes.  >50% time spent on counselling and coordination of care pt, husband   POSSIBLE D/C IN *1-2* DAYS, DEPENDING ON CLINICAL CONDITION.  Note: This dictation was prepared with Dragon dictation along with smaller phrase technology. Any transcriptional errors that result from this process are unintentional.  Yousuf Ager M.D on 07/11/2017 at 6:00 PM  Between 7am to 6pm - Pager - [phone]  After 6pm go to [email or web address] - Proofreader  [facility]  Office  [phone]  CC: Primary care physician; Gennette Pac, NP

## 2017-07-11 NOTE — Progress Notes (Signed)
Pt extubated to 2L [place] 96%. BS bilateral and clear. No stridor appreceiated. No respiratory distress noted.

## 2017-07-11 NOTE — Progress Notes (Signed)
Pt arrived from ICU alert and oriented. Family at bedside. No SOB, no c/o pain. VSS. No concerns offered from pt. Telemetry an d skin verified. Opt right hand discolored, edematous and cold to touch. Per ICU MD is aware. Radial pulse +2.

## 2017-07-12 ENCOUNTER — Encounter: Admission: EM | Disposition: E | Payer: Self-pay | Source: Home / Self Care | Attending: Internal Medicine

## 2017-07-12 ENCOUNTER — Inpatient Hospital Stay: Payer: Medicare Other

## 2017-07-12 LAB — BASIC METABOLIC PANEL
Anion gap: 7 (ref 5–15)
BUN: 12 mg/dL (ref 6–20)
CALCIUM: 7.7 mg/dL — AB (ref 8.9–10.3)
CHLORIDE: 101 mmol/L (ref 101–111)
CO2: 28 mmol/L (ref 22–32)
CREATININE: 0.64 mg/dL (ref 0.44–1.00)
GFR calc Af Amer: >60 mL/min (ref >60)
GFR calc non Af Amer: >60 mL/min (ref >60)
GLUCOSE: 125 mg/dL — AB (ref 65–99)
Potassium: 3.5 mmol/L (ref 3.5–5.1)
Sodium: 136 mmol/L (ref 135–145)

## 2017-07-12 LAB — CBC
HEMATOCRIT: 30.6 % — AB (ref 35.0–47.0)
HEMOGLOBIN: 10.2 g/dL — AB (ref 12.0–16.0)
MCH: 29.2 pg (ref 26.0–34.0)
MCHC: 33.3 g/dL (ref 32.0–36.0)
MCV: 87.8 fL (ref 80.0–100.0)
Platelets: 119 10*3/uL — ABNORMAL LOW (ref 150–440)
RBC: 3.48 MIL/uL — ABNORMAL LOW (ref 3.80–5.20)
RDW: 16.1 % — AB (ref 11.5–14.5)
WBC: 13.7 10*3/uL — ABNORMAL HIGH (ref 3.6–11.0)

## 2017-07-12 SURGERY — INSERTION, CARDIAC PACEMAKER
Anesthesia: Choice

## 2017-07-12 SURGERY — INSERTION, CARDIAC PACEMAKER
Anesthesia: Moderate Sedation | Laterality: Left

## 2017-07-12 MED ORDER — FUROSEMIDE 10 MG/ML IJ SOLN
INTRAMUSCULAR | Status: AC
  Filled 2017-07-12: qty 4

## 2017-07-12 MED ORDER — CEFTRIAXONE SODIUM 1 G IJ SOLR
1.0000 g | INTRAMUSCULAR | Status: DC
  Administered 2017-07-12 – 2017-07-14 (×3): 1 g via INTRAVENOUS
  Filled 2017-07-12 (×4): qty 10

## 2017-07-12 MED ORDER — SODIUM CHLORIDE 0.9 % IV SOLN: INTRAVENOUS | Status: DC

## 2017-07-12 MED ORDER — SODIUM CHLORIDE 0.9 % IR SOLN
80.0000 mg | Status: AC
  Filled 2017-07-12: qty 2

## 2017-07-12 MED ORDER — SODIUM CHLORIDE 0.9% FLUSH: 10.0000 mL | INTRAVENOUS | Status: DC | PRN

## 2017-07-12 MED ORDER — DEXTROSE 5 % IV SOLN
500.0000 mg | INTRAVENOUS | Status: DC
  Administered 2017-07-12 – 2017-07-14 (×3): 500 mg via INTRAVENOUS
  Filled 2017-07-12 (×4): qty 500

## 2017-07-12 MED ORDER — FUROSEMIDE 10 MG/ML IJ SOLN
40.0000 mg | Freq: Three times a day (TID) | INTRAMUSCULAR | Status: DC
Start: 2017-07-12 — End: 2017-07-14
  Administered 2017-07-12 – 2017-07-14 (×7): 40 mg via INTRAVENOUS
  Filled 2017-07-12 (×7): qty 4

## 2017-07-12 MED ORDER — FUROSEMIDE 10 MG/ML IJ SOLN
40.0000 mg | Freq: Once | INTRAMUSCULAR | Status: AC
  Administered 2017-07-12: 40 mg via INTRAVENOUS

## 2017-07-12 MED ORDER — IPRATROPIUM-ALBUTEROL 0.5-2.5 (3) MG/3ML IN SOLN
3.0000 mL | Freq: Three times a day (TID) | RESPIRATORY_TRACT | Status: DC
  Administered 2017-07-13 – 2017-07-14 (×6): 3 mL via RESPIRATORY_TRACT
  Filled 2017-07-12 (×6): qty 3

## 2017-07-12 MED ORDER — GUAIFENESIN-DM 100-10 MG/5ML PO SYRP
5.0000 mL | ORAL_SOLUTION | ORAL | Status: DC | PRN
  Administered 2017-07-12 – 2017-07-13 (×2): 5 mL via ORAL
  Filled 2017-07-12 (×3): qty 5

## 2017-07-12 MED ORDER — POTASSIUM CHLORIDE CRYS ER 20 MEQ PO TBCR
40.0000 meq | EXTENDED_RELEASE_TABLET | Freq: Once | ORAL | Status: AC
  Administered 2017-07-12: 40 meq via ORAL
  Filled 2017-07-12: qty 2

## 2017-07-12 MED ORDER — POTASSIUM CHLORIDE CRYS ER 20 MEQ PO TBCR
40.0000 meq | EXTENDED_RELEASE_TABLET | Freq: Two times a day (BID) | ORAL | Status: DC
  Administered 2017-07-12 – 2017-07-14 (×4): 40 meq via ORAL
  Filled 2017-07-12 (×4): qty 2

## 2017-07-12 NOTE — Progress Notes (Signed)
SLP Cancellation Note  Patient Details Name: MATHEW STORCK MRN: [id number] DOB: 20-May-1946   Cancelled treatment:       Reason Eval/Treat Not Completed: Medical issues which prohibited therapy. Reviewed chart. Pt is currently NPO d/t a planned procedure. Will f/u tomorrow re: toleration of diet as pt is available.   Zuehl,Maaran 06/21/2017, 12:03 PM

## 2017-07-12 NOTE — Care Management (Signed)
Barrier- Arrest with CPR/intubation within last 48 hours. Marked decrease in EF in four days.  having pacemaker insertion wire repositioned today.  IV lasix started 7/26.

## 2017-07-12 NOTE — Progress Notes (Signed)
Patient Name: Tanya Wells Date of Encounter: 07/07/2017  Primary Cardiologist: End  Hospital Problem List     Principal Problem:   Syncope Active Problems:   Carcinoid tumor   Orthostatic hypotension   Sinus pause   Tachycardia-bradycardia syndrome (HCC)   Elevated troponin   Encounter for central line placement   Cardiac arrest ([place])   Respiratory failure ([place])   Acute systolic heart failure (HCC)     Subjective   No acute overnight events. For PPM lead repositioning this morning. Potassium improving. Mild hemoptysis. SOB stable.   Inpatient Medications    Scheduled Meds: . atorvastatin  20 mg Per Tube q1800  . chlorhexidine gluconate (MEDLINE KIT)  15 mL Mouth Rinse BID  . dorzolamide-timolol  1 drop Both Eyes BID  . enoxaparin (LOVENOX) injection  40 mg Subcutaneous Q24H  . gentamicin irrigation  80 mg Irrigation On Call  . ipratropium-albuterol  3 mL Nebulization Q6H  . latanoprost  1 drop Both Eyes QHS  . levothyroxine  125 mcg Oral QAC breakfast   Continuous Infusions: . sodium chloride 75 mL/hr at 07/11/17 2330  . sodium chloride    .  ceFAZolin (ANCEF) IV    . famotidine (PEPCID) IV Stopped (07/11/17 2203)   PRN Meds: acetaminophen **OR** acetaminophen, bisacodyl, docusate, fentaNYL (SUBLIMAZE) injection, fentaNYL (SUBLIMAZE) injection, guaiFENesin-dextromethorphan, [DISCONTINUED] ondansetron **OR** ondansetron (ZOFRAN) IV   Vital Signs    Vitals:   07/11/17 1952 07/11/17 2028 06/17/2017 0304 06/19/2017 0441  BP: 105/61   116/75  Pulse: 69   63  Resp: 18   18  Temp: 98.2 F (36.8 C)   99.3 F (37.4 C)  TempSrc: Oral   Oral  SpO2: 99% 98% 99% 97%  Weight:      Height:        Intake/Output Summary (Last 24 hours) at 07/06/2017 0719 Last data filed at 06/21/2017 0300  Gross per 24 hour  Intake          1046.25 ml  Output             1050 ml  Net            -3.75 ml   Filed Weights   06/21/2017 1026 07/17/2017 1630 06/24/2017 1207  Weight: 127 lb  (57.6 kg) 132 lb 8 oz (60.1 kg) 132 lb (59.9 kg)    Physical Exam    GEN: Well nourished, well developed, in no acute distress.  HEENT: Grossly normal.  Neck: Supple, no JVD, carotid bruits, or masses. Cardiac: RRR, no murmurs, rubs, or gallops. No clubbing, cyanosis, edema.  Radials/DP/PT 2+ and equal bilaterally.  Respiratory:  Diminished breath sounds bilaterally with bibasilar crackles. GI: Soft, nontender, nondistended, BS + x 4. MS: No deformity or atrophy. Ecchymosis and STS of the right wrist/hand. Right radial pulse 2+. Skin: warm and dry, no rash. Neuro:  Strength and sensation are intact. Psych: AAOx3.  Normal affect.  Labs    CBC  Recent Labs  07/13/2017 1637 07/16/2017 0411  WBC 21.2* 13.7*  HGB 12.7 10.2*  HCT 39.2 30.6*  MCV 90.3 87.8  PLT 127* 449*   Basic Metabolic Panel  Recent Labs  07/15/2017 1700 07/07/2017 0411  NA 156* 136  K 3.1* 3.5  CL 97* 101  CO2 45* 28  GLUCOSE 209* 125*  BUN 9 12  CREATININE 0.90 0.64  CALCIUM 6.5* 7.7*   Liver Function Tests  Recent Labs  07/05/2017 1700  AST 145*  ALT 113*  ALKPHOS 141*  BILITOT 1.1  PROT 3.5*  ALBUMIN 2.0*   No results for input(s): LIPASE, AMYLASE in the last 72 hours. Cardiac Enzymes  Recent Labs  07/09/2017 1700 07/11/2017 2229 07/11/17 0806  TROPONINI 0.80* 0.91* 0.68*   BNP Invalid input(s): POCBNP D-Dimer No results for input(s): DDIMER in the last 72 hours. Hemoglobin A1C No results for input(s): HGBA1C in the last 72 hours. Fasting Lipid Panel No results for input(s): CHOL, HDL, LDLCALC, TRIG, CHOLHDL, LDLDIRECT in the last 72 hours. Thyroid Function Tests No results for input(s): TSH, T4TOTAL, T3FREE, THYROIDAB in the last 72 hours.  Invalid input(s): FREET3  Telemetry    Paced - Personally Reviewed  ECG    n/a - Personally Reviewed  Radiology    Ct Head Wo Contrast  Result Date: 07/09/2017 CLINICAL DATA:  72 year old female post code blue. Down for 15 minutes.  Initial encounter. EXAM: CT HEAD WITHOUT CONTRAST TECHNIQUE: Contiguous axial images were obtained from the base of the skull through the vertex without intravenous contrast. COMPARISON:  07/05/2017. FINDINGS: Brain: No intracranial hemorrhage or CT evidence of large acute infarct. Currently no definitive evidence of global hypoxia. This may present in a delayed fashion and possibility can be evaluated on follow-up depending on the patient's clinical status. Mild global atrophy. No intracranial mass lesion noted on this unenhanced exam. Vascular: Vascular calcification Skull: No acute abnormality Sinuses/Orbits: No acute orbital abnormality. Visualized lungs clear. Other: Mastoid air cells and middle ear cavities clear. IMPRESSION: Currently no CT evidence to suggest global hypoxia. If the patient has clinical findings to suggest such, this could be further evaluated on follow-up CT as CT evidence of hypoxia may present in a delayed fashion. No intracranial hemorrhage or CT evidence of large acute infarct. Electronically Signed   By: Genia Del M.D.   On: 07/11/2017 19:10   Ct Angio Chest Pe W Or Wo Contrast  Result Date: 07/09/2017 CLINICAL DATA:  Post code after pacemaker EXAM: CT ANGIOGRAPHY CHEST WITH CONTRAST TECHNIQUE: Multidetector CT imaging of the chest was performed using the standard protocol during bolus administration of intravenous contrast. Multiplanar CT image reconstructions and MIPs were obtained to evaluate the vascular anatomy. CONTRAST:  75 mL Isovue 370 intravenous COMPARISON:  Radiograph 06/24/2017, CT chest 07/05/2017, PET-CT 04/03/2017 FINDINGS: Cardiovascular: Satisfactory opacification of the pulmonary arteries to the segmental level. No evidence of pulmonary embolism. Non aneurysmal aorta. Atherosclerosis is present. Coronary artery calcification. Small amount of iatrogenic in the pulmonary trunk. Left-sided cardiac pacing device with leads in the right atrium and right ventricle.  Borderline cardiomegaly. Small pericardial effusion. Mediastinum/Nodes: No thyroid mass. Endotracheal tube tip terminates about a cm above the carina. Nonspecific mediastinal lymph nodes, slightly increased compared to prior study. Esophagus unremarkable Lungs/Pleura: Small to moderate bilateral pleural effusions. Dense consolidation within the bilateral lower lobes. Ground-glass density within the right upper lobe with patchy ground-glass density in the right middle lobe. 4 mm left upper lobe pulmonary nodule, series 7, image number 23. Oval nodular density subpleural left upper lobe series 7, image number 32. No pneumothorax. Upper Abdomen: Known hepatic metastatic disease is not well visualized on today's study due to timing of contrast bolus. Musculoskeletal: No acute or suspicious bone lesions are seen. Review of the MIP images confirms the above findings. IMPRESSION: 1. Negative for acute pulmonary embolus 2. Small to moderate bilateral pleural effusions with dense bilateral lower lobe consolidations which may reflect atelectasis, pneumonia or aspiration. Ground-glass density in the right upper and middle lobes may  reflect asymmetric edema or possibly infection. 3. 4 mm left upper lobe pulmonary nodule, probably infectious or inflammatory given time course of development. 4. Known hepatic metastatic disease not well appreciated on the current study. Aortic Atherosclerosis (ICD10-I70.0). Electronically Signed   By: Donavan Foil M.D.   On: 07/16/2017 19:23   Dg Chest Port 1 View  Result Date: 07/11/2017 CLINICAL DATA:  Respiratory failure EXAM: PORTABLE CHEST 1 VIEW COMPARISON:  07/06/2017 FINDINGS: Endotracheal tube remains satisfactorily positioned, 2.6 cm above the carina. Grossly intact transvenous cardiac leads. Probable pleural effusions. Patchy central airspace opacity in the right lung, appearing partially cleared compared to 06/25/2017. IMPRESSION: 1.  Support equipment appears satisfactorily  positioned. 2. Partial clearance of central right lung airspace opacity. Persistent pleural effusions. Electronically Signed   By: Andreas Newport M.D.   On: 07/11/2017 05:29   Dg Chest Port 1 View  Result Date: 07/11/2017 CLINICAL DATA:  line placement EXAM: PORTABLE CHEST 1 VIEW COMPARISON:  07/17/2017 FINDINGS: Endotracheal tube tip is approximately 2.6 cm superior to the carina. Left-sided duo lead pacing device as before. Interim development of diffuse ground-glass density throughout the right thorax. Linear scarring on the left. Small right effusion. Normal heart size. No pneumothorax. No definite additional ascending or descending catheters are seen. IMPRESSION: 1. Endotracheal tube tip about 2.6 cm superior to carina 2. Tiny right pleural effusion. Development of diffuse ground-glass density in the right thorax, findings could be secondary to asymmetric edema, diffuse infection, or hemorrhage. Electronically Signed   By: Donavan Foil M.D.   On: 07/03/2017 17:38   Dg Chest Port 1 View  Result Date: 07/16/2017 CLINICAL DATA:  Post pacemaker insertion, history of carcinoid tumor of the stomach EXAM: PORTABLE CHEST 1 VIEW COMPARISON:  CT chest of 07/05/2017 and chest x-ray of the same day FINDINGS: The heart is borderline enlarged and there is some fullness of the perihilar vasculature suggesting mild fluid overload. Tiny pleural effusions cannot be excluded. Permanent pacemaker is now present. No bony abnormality is seen. IMPRESSION: 1. Question mild pulmonary vascular congestion. 2. Cannot exclude small pleural effusions. 3. Permanent pacemaker is present. Electronically Signed   By: Ivar Drape M.D.   On: 06/22/2017 13:53   Dg C-arm 1-60 Min-no Report  Result Date: 07/11/2017 Fluoroscopy was utilized by the requesting physician.  No radiographic interpretation.    Cardiac Studies   TTE 07/11/2017: Study Conclusions  - Left ventricle: Wall thickness was increased in a pattern of    moderate LVH. Systolic function was severely reduced. The   estimated ejection fraction was in the range of 25% to 30%. - Pericardium, extracardiac: Features were not consistent with   tamponade physiology.  TTE 07/06/2017: Study Conclusions  - Left ventricle: The cavity size was normal. Wall thickness was   normal. Systolic function was normal. The estimated ejection   fraction was in the range of 60% to 65%. Wall motion was normal;   there were no regional wall motion abnormalities. Left   ventricular diastolic function parameters were normal. - Mitral valve: There was mild regurgitation. - Pulmonary arteries: Systolic pressure was mildly increased. PA   peak pressure: 38 mm Hg (S).  Patient Profile     71 y.o. female with history of metastatic carcinoid tumor of the stomach, followed by Dr. Grayland Ormond, MD, HLD, hypothyroidism, liver cysts, GERD and depression who presented to [facility] on 7/22 with syncope. Found to have intermittent 3rd degree AV block. Post procedure course complicated by cardiac arrest on 7/24 withagonal  breathing, unresponsive with no palpable pulse. CPR was started for approximately 15-20 minutes. Reports of VF that transitioned to PEA prior to defibrillation. She was given 3 rounds of Epi with continued CPR. She was intubated. ROSC was obtained ~ 20 minutes into CPR. Stat bedside echo  did not show pericardial effusion. Pressors briefly given to maintain MAP > 65. Telemetry showed initial pacing followed by some ectopy. Heart rate up to 155 bpm after epinephrine given. Head CT scan unrevealing. CTA chest negative for PE. Preliminary EF of 20%, possible stress-induced. Extubated 7/25. Given events from above pacer lead has dislodged.  Assessment & Plan    1. Cardiorespiratory arrest: -CPR/ACLS as above -Now extubated -Etiology remains unclear -Perhaps serotonin response from her carcinoid -Severely depressed EF on 7/254 without tamponade physiology  -Consider follow up  echo after several weeks of medical management -Consider stress-induced  2. Syncope/sinus pause/tachy-brady syndrome: -Likely multifactorial including tachy-brady syndrome, sinus pause, orthostasis and in the setting of known carcinoid tumor -Not on any rate limiting medications, including eye drops -Status post PPM implantation 4/58 complicated as above -Plan for repositioning PPM lead today per Dr. Saralyn Pilar, MD -Per IM, there are no contraindications for PPM per oncology  -Potassium has been repleted -Monitor on telemetry -Carotid ultrasound with 1-49% stenosis of the RICA 2/2 focal heterogeneous atherosclerotic plaque -No evidence of atherosclerotic plaque in the LICA  3. Stress induced cardiomyopathy: -As above -BP has been on the softer side into the 09X systolic -Would start evidence based HF therapy as BP allows including beta blocker, ACE/ARB/Entresto/ANRI -Repeat echo as an outpatient as above -Continue IV Lasix -For now, not on HF medications given hypotension (BP stable in the low 100s currently), start when able  4. Orthostatic hypotension: -IV fluids -Not on any antihypertensives at home  5. Elevated troponin: -Never with chest pain -Likely supply demand ischemia in the setting of hypotension with syncope/tachy-brady syndrome -TTE as above -If EF does not improve with follow up echo, would need outpatient cath  6. Hypokalemia: -Replete to goal of 4.0 -Add magnesium oxide 400 mg daily  7. Carcinoid tumor: -No contraindications for PPM per IM  8. Hypothyroidism: -TSH normal  9. Hemoptysis: -Possibly related to carcinoid vs CPR -Per IM -CBC mostly stable  -Monitor   Signed, Marcille Blanco [place] Pager: [phone] 06/20/2017, 7:19 AM   Attending Note Patient seen and examined, agree with detailed note above,  Patient presentation and plan discussed on rounds.   On evaluation this morning, Ms. Truett was acutely short of breath,  moderately diaphoretic Arms  edematous bilaterally, Discussed with nurses, blood pressure obtained hypertensive with systolics in the 76B She was sat up in bed, Lasix orally IV administered rapidly, oxygen increase nasal cannula She reported she was doing well is morning and developed acute shortness of breath around 9:30 AM She is on IV fluids started preprocedure order set for possible pacemaker wire repositioning around noon with Dr. Josefa Half.  CT scan chest reviewed showing moderate sized pleural effusion bilaterally Chest x-ray also with pulmonary edema, most notable after the procedure 2 days ago  On physical exam JVD elevated 12-15, dull at the bases bilaterally, heart sounds regular 2/6 systolic ejection murmur heard left sternal border, abdomen soft nontender, Trace pitting edema in her arms, no significant lower extremity edema Ecchymosis/bruising noted right hand  Lab work reviewed showing potassium 3.5, sodium 136, creatinine 0.64, peak troponin 0.9, hematocrit 30  --- Acute on chronic systolic CHF Acute drop in her LV  function this admission, etiology still unclear Pleural effusion likely secondary to systolic CHF Likely with acute pulmonary edema this morning Lasix 40 IV 1 given, IV fluids held I did check in with nurses after several hours and she had put out 1 L We will give additional Lasix early this afternoon  --- Syncope Pacemaker place for pauses Wrap to evaluate pacemaker, seen by Dr. Josefa Half and Dr. Caryl Comes  --- Cardiorespiratory arrest Received CPR 20 minutes, acute drop in her ejection fraction Minimal climb in her troponin, 0.9 Possible stress cardiomyopathy Plan as above, aggressive diuresis for now All procedures on hold  Long discussion with Dr.  Caryl Comes, nursing and hospitalist service  Greater than 50% was spent in counseling and coordination of care with patient Total encounter time 35 minutes or more   Signed: Esmond Plants  M.D., Ph.D. [facility]

## 2017-07-12 NOTE — Progress Notes (Signed)
[facility] [place] NAME: Tanya Wells    MR#:  [id number]  DATE OF BIRTH:  [birth year]  SUBJECTIVE:  Patient currently complaining of cough and congestion with some bloody sputum    REVIEW OF SYSTEMS:   Review of Systems  Constitutional: Negative for chills, fever and weight loss.  HENT: Negative for ear discharge, ear pain and nosebleeds.   Eyes: Negative for blurred vision, pain and discharge.  Respiratory: Positive for cough and hemoptysis. Negative for sputum production, shortness of breath, wheezing and stridor.   Cardiovascular: Positive for leg swelling. Negative for chest pain, palpitations, orthopnea and PND.  Gastrointestinal: Negative for abdominal pain, diarrhea, nausea and vomiting.  Genitourinary: Negative for frequency and urgency.  Musculoskeletal: Negative for back pain and joint pain.  Neurological: Negative for sensory change, speech change, focal weakness and weakness.  Psychiatric/Behavioral: Negative for depression and hallucinations. The patient is not nervous/anxious.    Tolerating Diet:yes Tolerating PT: pending  DRUG ALLERGIES:   Allergies  Allergen Reactions  . Codeine Nausea Only    VITALS:  Blood pressure 121/74, pulse 61, temperature 99.3 F (37.4 C), temperature source Oral, resp. rate 20, height 4\' 11"  (1.499 m), weight 132 lb (59.9 kg), SpO2 99 %.  PHYSICAL EXAMINATION:   Physical Exam  GENERAL:  71 y.o.-year-old patient lying in the bed with no acute distress.  EYES: Pupils equal, round, reactive to light and accommodation. No scleral icterus. Extraocular muscles intact.  HEENT: Head atraumatic, normocephalic. Oropharynx and nasopharynx clear.  NECK:  Supple, no jugular venous distention. No thyroid enlargement, no tenderness.  LUNGS:Rhonchus breath sounds within both lungs, dressing from pacemaker present  CARDIOVASCULAR: S1, S2 normal. No murmurs, rubs, or gallops.  ABDOMEN: Soft,  nontender, nondistended. Bowel sounds present. No organomegaly or mass.  EXTREMITIES: Right upper extremity swelling from her hand down to her shoulder    NEUROLOGIC: Cranial nerves II through XII are intact. No focal Motor or sensory deficits b/l.   PSYCHIATRIC:  patient is alert and oriented x 3.  SKIN: No obvious rash, lesion, or ulcer.   LABORATORY PANEL:  CBC  Recent Labs Lab 07/09/2017 0411  WBC 13.7*  HGB 10.2*  HCT 30.6*  PLT 119*    Chemistries   Recent Labs Lab 07/09/17 0518 07/09/2017 1700 07/03/2017 0411  NA 140 156* 136  K 4.4 3.1* 3.5  CL 112* 97* 101  CO2 22 45* 28  GLUCOSE 117* 209* 125*  BUN 8 9 12   CREATININE 0.77 0.90 0.64  CALCIUM 8.0* 6.5* 7.7*  MG 1.9  --   --   AST  --  145*  --   ALT  --  113*  --   ALKPHOS  --  141*  --   BILITOT  --  1.1  --    Cardiac Enzymes  Recent Labs Lab 07/11/17 0806  TROPONINI 0.68*   RADIOLOGY:  Ct Head Wo Contrast  Result Date: 07/15/2017 CLINICAL DATA:  71 year old female post code blue. Down for 15 minutes. Initial encounter. EXAM: CT HEAD WITHOUT CONTRAST TECHNIQUE: Contiguous axial images were obtained from the base of the skull through the vertex without intravenous contrast. COMPARISON:  07/05/2017. FINDINGS: Brain: No intracranial hemorrhage or CT evidence of large acute infarct. Currently no definitive evidence of global hypoxia. This may present in a delayed fashion and possibility can be evaluated on follow-up depending on the patient's clinical status. Mild global atrophy. No intracranial mass lesion noted on this  unenhanced exam. Vascular: Vascular calcification Skull: No acute abnormality Sinuses/Orbits: No acute orbital abnormality. Visualized lungs clear. Other: Mastoid air cells and middle ear cavities clear. IMPRESSION: Currently no CT evidence to suggest global hypoxia. If the patient has clinical findings to suggest such, this could be further evaluated on follow-up CT as CT evidence of hypoxia may  present in a delayed fashion. No intracranial hemorrhage or CT evidence of large acute infarct. Electronically Signed   By: Genia Del M.D.   On: 06/27/2017 19:10   Ct Angio Chest Pe W Or Wo Contrast  Result Date: 06/30/2017 CLINICAL DATA:  Post code after pacemaker EXAM: CT ANGIOGRAPHY CHEST WITH CONTRAST TECHNIQUE: Multidetector CT imaging of the chest was performed using the standard protocol during bolus administration of intravenous contrast. Multiplanar CT image reconstructions and MIPs were obtained to evaluate the vascular anatomy. CONTRAST:  75 mL Isovue 370 intravenous COMPARISON:  Radiograph 07/09/2017, CT chest 07/05/2017, PET-CT 04/03/2017 FINDINGS: Cardiovascular: Satisfactory opacification of the pulmonary arteries to the segmental level. No evidence of pulmonary embolism. Non aneurysmal aorta. Atherosclerosis is present. Coronary artery calcification. Small amount of iatrogenic in the pulmonary trunk. Left-sided cardiac pacing device with leads in the right atrium and right ventricle. Borderline cardiomegaly. Small pericardial effusion. Mediastinum/Nodes: No thyroid mass. Endotracheal tube tip terminates about a cm above the carina. Nonspecific mediastinal lymph nodes, slightly increased compared to prior study. Esophagus unremarkable Lungs/Pleura: Small to moderate bilateral pleural effusions. Dense consolidation within the bilateral lower lobes. Ground-glass density within the right upper lobe with patchy ground-glass density in the right middle lobe. 4 mm left upper lobe pulmonary nodule, series 7, image number 23. Oval nodular density subpleural left upper lobe series 7, image number 32. No pneumothorax. Upper Abdomen: Known hepatic metastatic disease is not well visualized on today's study due to timing of contrast bolus. Musculoskeletal: No acute or suspicious bone lesions are seen. Review of the MIP images confirms the above findings. IMPRESSION: 1. Negative for acute pulmonary embolus  2. Small to moderate bilateral pleural effusions with dense bilateral lower lobe consolidations which may reflect atelectasis, pneumonia or aspiration. Ground-glass density in the right upper and middle lobes may reflect asymmetric edema or possibly infection. 3. 4 mm left upper lobe pulmonary nodule, probably infectious or inflammatory given time course of development. 4. Known hepatic metastatic disease not well appreciated on the current study. Aortic Atherosclerosis (ICD10-I70.0). Electronically Signed   By: Donavan Foil M.D.   On: 07/05/2017 19:23   US Venous Img Lower Unilateral Right  Result Date: 07/15/2017 CLINICAL DATA:  Acute right lower extremity swelling. EXAM: Right LOWER EXTREMITY VENOUS DOPPLER ULTRASOUND TECHNIQUE: Gray-scale sonography with graded compression, as well as color Doppler and duplex ultrasound were performed to evaluate the lower extremity deep venous systems from the level of the common femoral vein and including the common femoral, femoral, profunda femoral, popliteal and calf veins including the posterior tibial, peroneal and gastrocnemius veins when visible. The superficial great saphenous vein was also interrogated. Spectral Doppler was utilized to evaluate flow at rest and with distal augmentation maneuvers in the common femoral, femoral and popliteal veins. COMPARISON:  Ultrasound of July 05, 2017. FINDINGS: Contralateral Common Femoral Vein: Respiratory phasicity is normal and symmetric with the symptomatic side. No evidence of thrombus. Normal compressibility. Common Femoral Vein: No evidence of thrombus. Normal compressibility, respiratory phasicity and response to augmentation. Saphenofemoral Junction: There appears to be nonocclusive thrombus in the right greater saphenous vein near its junction with the femoral vein, but  does not extend into the right common femoral vein. Profunda Femoral Vein: No evidence of thrombus. Normal compressibility and flow on color Doppler  imaging. Femoral Vein: No evidence of thrombus. Normal compressibility, respiratory phasicity and response to augmentation. Popliteal Vein: No evidence of thrombus. Normal compressibility, respiratory phasicity and response to augmentation. Calf Veins: No evidence of thrombus. Normal compressibility and flow on color Doppler imaging. Superficial Great Saphenous Vein: As described above, nonocclusive thrombus is seen near the saphenofemoral junction. Venous Reflux:  None. Other Findings:  None. IMPRESSION: Nonocclusive thrombus seen in the right greater saphenous vein near the saphenofemoral junction, but not seen to extend into the right common femoral vein. Given the proximity of the thrombus to the saphenofemoral junction, this is considered deep venous thrombosis. No other thrombus is noted. These results will be called to the ordering clinician or representative by the Radiologist Assistant, and communication documented in the PACS or zVision Dashboard. Electronically Signed   By: Marijo Conception, M.D.   On: 07/11/2017 13:42   Dg Chest Port 1 View  Result Date: 07/11/2017 CLINICAL DATA:  Respiratory failure EXAM: PORTABLE CHEST 1 VIEW COMPARISON:  06/27/2017 FINDINGS: Endotracheal tube remains satisfactorily positioned, 2.6 cm above the carina. Grossly intact transvenous cardiac leads. Probable pleural effusions. Patchy central airspace opacity in the right lung, appearing partially cleared compared to 07/07/2017. IMPRESSION: 1.  Support equipment appears satisfactorily positioned. 2. Partial clearance of central right lung airspace opacity. Persistent pleural effusions. Electronically Signed   By: Andreas Newport M.D.   On: 07/11/2017 05:29   Dg Chest Port 1 View  Result Date: 07/11/2017 CLINICAL DATA:  line placement EXAM: PORTABLE CHEST 1 VIEW COMPARISON:  06/20/2017 FINDINGS: Endotracheal tube tip is approximately 2.6 cm superior to the carina. Left-sided duo lead pacing device as before. Interim  development of diffuse ground-glass density throughout the right thorax. Linear scarring on the left. Small right effusion. Normal heart size. No pneumothorax. No definite additional ascending or descending catheters are seen. IMPRESSION: 1. Endotracheal tube tip about 2.6 cm superior to carina 2. Tiny right pleural effusion. Development of diffuse ground-glass density in the right thorax, findings could be secondary to asymmetric edema, diffuse infection, or hemorrhage. Electronically Signed   By: Donavan Foil M.D.   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  1. Cardiorespiratory arrest after PM placement -pt underwent CPR and ACLS protocol with intubation---now extubated -HR and BP stable -Planned for patient to go back for wire repositioning   2. Syncope with significant orthostasis: Patient with systolic dysfunction now, EP physiologist has seen the patient is a prolonged QT patient currently being worked up for further evaluation  3. Malignant carcinoid tumor of the stomach causing orthostasis and flushing Spoke with Dr. Grayland Ormond. Patient currently is getting subcutaneous shots for chemotherapy. She is due for a shot however since the chemotherapy medication as outpatient will reschedule it at a later date.   4. possible pneumonia with bilateral pleural effusions We'll start patient on IV antibiotics,  Cardiology will be given patient's trial of IV Lasix  5. Hypothyroidism: Continue Synthroid  6. Depression: Continue Prozac  7. Upper extremity swelling will obtain a Doppler of the arms  Discussed with husband.  Case discussed with Care Management/Social Worker. Management plans discussed with the patient, family and they are in agreement.  CODE STATUS:  full  DVT Prophylaxis:  lovenox  TOTAL TIME TAKING CARE OF THIS PATIENT: 25 minutes.  >50% time spent on counselling and coordination of care pt, husband   POSSIBLE D/C IN *1-2* DAYS, DEPENDING ON CLINICAL CONDITION.  Note: This dictation was prepared with Dragon dictation along with smaller phrase technology. Any transcriptional errors that result from this process are unintentional.  Dustin Flock M.D on 07/07/2017 at 1:53 PM  Between 7am to 6pm - Pager - [phone]  After 6pm go to [email or web address] - Proofreader  [facility]  Office  [phone]  CC: Primary care physician; Gennette Pac, NP

## 2017-07-13 LAB — BASIC METABOLIC PANEL
ANION GAP: 8 (ref 5–15)
BUN: 12 mg/dL (ref 6–20)
CO2: 31 mmol/L (ref 22–32)
Calcium: 8.1 mg/dL — ABNORMAL LOW (ref 8.9–10.3)
Chloride: 100 mmol/L — ABNORMAL LOW (ref 101–111)
Creatinine, Ser: 0.69 mg/dL (ref 0.44–1.00)
GLUCOSE: 141 mg/dL — AB (ref 65–99)
POTASSIUM: 4 mmol/L (ref 3.5–5.1)
Sodium: 139 mmol/L (ref 135–145)

## 2017-07-13 LAB — CBC
HEMATOCRIT: 30.3 % — AB (ref 35.0–47.0)
Hemoglobin: 10.1 g/dL — ABNORMAL LOW (ref 12.0–16.0)
MCH: 28.5 pg (ref 26.0–34.0)
MCHC: 33.3 g/dL (ref 32.0–36.0)
MCV: 85.8 fL (ref 80.0–100.0)
Platelets: 119 10*3/uL — ABNORMAL LOW (ref 150–440)
RBC: 3.53 MIL/uL — AB (ref 3.80–5.20)
RDW: 15.3 % — ABNORMAL HIGH (ref 11.5–14.5)
WBC: 10.4 10*3/uL (ref 3.6–11.0)

## 2017-07-13 MED ORDER — GUAIFENESIN ER 600 MG PO TB12
600.0000 mg | ORAL_TABLET | Freq: Two times a day (BID) | ORAL | Status: DC
  Administered 2017-07-13 – 2017-07-14 (×2): 600 mg via ORAL
  Filled 2017-07-13 (×2): qty 1

## 2017-07-13 NOTE — Progress Notes (Signed)
07/13/2017 0425: Patient called care nurse to her with c/o SOB. O2sat was 91%. Dr. Marcille Blanco was notified, 0600 scheduled lasix was given per earlier per Dr. Marcille Blanco.  Patient stated that she feels better after the Lasix. Will continue to monitor . Patient remained NPO overnight for scheduled PPM review/interogation

## 2017-07-13 NOTE — Care Management (Addendum)
Barrier- continued need for IV lasix.  Have requested physical therapy consult from attending.  Patient would be agreeable to consider any discharge dispositions recommended.

## 2017-07-13 NOTE — Progress Notes (Signed)
[facility] [place] NAME: Tanya Wells    MR#:  [id number]  DATE OF BIRTH:  January 27, 1946  SUBJECTIVE:  Continues to have cough with some blood Swelling in the arm improved No chest pain   REVIEW OF SYSTEMS:   Review of Systems  Constitutional: Negative for chills, fever and weight loss.  HENT: Negative for ear discharge, ear pain and nosebleeds.   Eyes: Negative for blurred vision, pain and discharge.  Respiratory: Positive for cough and hemoptysis. Negative for sputum production, shortness of breath, wheezing and stridor.   Cardiovascular: Positive for leg swelling. Negative for chest pain, palpitations, orthopnea and PND.  Gastrointestinal: Negative for abdominal pain, diarrhea, nausea and vomiting.  Genitourinary: Negative for frequency and urgency.  Musculoskeletal: Negative for back pain and joint pain.  Neurological: Negative for sensory change, speech change, focal weakness and weakness.  Psychiatric/Behavioral: Negative for depression and hallucinations. The patient is not nervous/anxious.    Tolerating Diet:yes Tolerating PT: pending  DRUG ALLERGIES:   Allergies  Allergen Reactions  . Codeine Nausea Only    VITALS:  Blood pressure 122/60, pulse 60, temperature 97.9 F (36.6 C), temperature source Oral, resp. rate (!) 21, height 4\' 11"  (1.499 m), weight 132 lb (59.9 kg), SpO2 99 %.  PHYSICAL EXAMINATION:   Physical Exam  GENERAL:  71 y.o.-year-old patient lying in the bed with no acute distress.  EYES: Pupils equal, round, reactive to light and accommodation. No scleral icterus. Extraocular muscles intact.  HEENT: Head atraumatic, normocephalic. Oropharynx and nasopharynx clear.  NECK:  Supple, no jugular venous distention. No thyroid enlargement, no tenderness.  LUNGS:Rhonchus breath sounds within both lungs, dressing from pacemaker present  CARDIOVASCULAR: S1, S2 normal. No murmurs, rubs, or gallops.   ABDOMEN: Soft, nontender, nondistended. Bowel sounds present. No organomegaly or mass.  EXTREMITIES: Right upper extremity swelling from her hand down to her shoulder    NEUROLOGIC: Cranial nerves II through XII are intact. No focal Motor or sensory deficits b/l.   PSYCHIATRIC:  patient is alert and oriented x 3.  SKIN: No obvious rash, lesion, or ulcer.   LABORATORY PANEL:  CBC  Recent Labs Lab 07/13/17 0401  WBC 10.4  HGB 10.1*  HCT 30.3*  PLT 119*    Chemistries   Recent Labs Lab 07/09/17 0518 06/23/2017 1700  07/13/17 0401  NA 140 156*  < > 139  K 4.4 3.1*  < > 4.0  CL 112* 97*  < > 100*  CO2 22 45*  < > 31  GLUCOSE 117* 209*  < > 141*  BUN 8 9  < > 12  CREATININE 0.77 0.90  < > 0.69  CALCIUM 8.0* 6.5*  < > 8.1*  MG 1.9  --   --   --   AST  --  145*  --   --   ALT  --  113*  --   --   ALKPHOS  --  141*  --   --   BILITOT  --  1.1  --   --   < > = values in this interval not displayed. Cardiac Enzymes  Recent Labs Lab 07/11/17 0806  TROPONINI 0.68*   RADIOLOGY:  US Venous Img Lower Unilateral Right  Result Date: 07/02/2017 CLINICAL DATA:  Acute right lower extremity swelling. EXAM: Right LOWER EXTREMITY VENOUS DOPPLER ULTRASOUND TECHNIQUE: Gray-scale sonography with graded compression, as well as color Doppler and duplex ultrasound were performed to evaluate the lower  extremity deep venous systems from the level of the common femoral vein and including the common femoral, femoral, profunda femoral, popliteal and calf veins including the posterior tibial, peroneal and gastrocnemius veins when visible. The superficial great saphenous vein was also interrogated. Spectral Doppler was utilized to evaluate flow at rest and with distal augmentation maneuvers in the common femoral, femoral and popliteal veins. COMPARISON:  Ultrasound of July 05, 2017. FINDINGS: Contralateral Common Femoral Vein: Respiratory phasicity is normal and symmetric with the symptomatic side. No  evidence of thrombus. Normal compressibility. Common Femoral Vein: No evidence of thrombus. Normal compressibility, respiratory phasicity and response to augmentation. Saphenofemoral Junction: There appears to be nonocclusive thrombus in the right greater saphenous vein near its junction with the femoral vein, but does not extend into the right common femoral vein. Profunda Femoral Vein: No evidence of thrombus. Normal compressibility and flow on color Doppler imaging. Femoral Vein: No evidence of thrombus. Normal compressibility, respiratory phasicity and response to augmentation. Popliteal Vein: No evidence of thrombus. Normal compressibility, respiratory phasicity and response to augmentation. Calf Veins: No evidence of thrombus. Normal compressibility and flow on color Doppler imaging. Superficial Great Saphenous Vein: As described above, nonocclusive thrombus is seen near the saphenofemoral junction. Venous Reflux:  None. Other Findings:  None. IMPRESSION: Nonocclusive thrombus seen in the right greater saphenous vein near the saphenofemoral junction, but not seen to extend into the right common femoral vein. Given the proximity of the thrombus to the saphenofemoral junction, this is considered deep venous thrombosis. No other thrombus is noted. These results will be called to the ordering clinician or representative by the Radiologist Assistant, and communication documented in the PACS or zVision Dashboard. Electronically Signed   By: Marijo Conception, M.D.   On: 06/17/2017 13:42   US Venous Img Upper Uni Right  Result Date: 07/15/2017 CLINICAL DATA:  RIGHT upper extremity swelling EXAM: RIGHT UPPER EXTREMITY VENOUS DOPPLER ULTRASOUND TECHNIQUE: Gray-scale sonography with graded compression, as well as color Doppler and duplex ultrasound were performed to evaluate the upper extremity deep venous system from the level of the subclavian vein and including the jugular, axillary, basilic, radial, ulnar and  upper cephalic vein. Spectral Doppler was utilized to evaluate flow at rest and with distal augmentation maneuvers. COMPARISON:  None FINDINGS: Contralateral Subclavian Vein: Respiratory phasicity is normal and symmetric with the symptomatic side. No evidence of thrombus. Normal compressibility. Internal Jugular Vein: No evidence of thrombus. Normal compressibility, respiratory phasicity and response to augmentation. Subclavian Vein: No evidence of thrombus. Normal compressibility, respiratory phasicity and response to augmentation. Axillary Vein: No evidence of thrombus. Normal compressibility, respiratory phasicity and response to augmentation. Cephalic Vein: No evidence of thrombus in the visualized proximal portion. Distal portion is not visualized. Normal compressibility, respiratory phasicity and response to augmentation. Basilic Vein: Not seen Brachial Veins: No evidence of thrombus. Normal compressibility, respiratory phasicity and response to augmentation. Radial Veins: No evidence of thrombus. Normal compressibility, respiratory phasicity and response to augmentation. Ulnar Veins: No evidence of thrombus. Normal compressibility, respiratory phasicity and response to augmentation. Venous Reflux:  None visualized. Other Findings:  Subcutaneous edema at wrist. IMPRESSION: No evidence of DVT within the RIGHT upper extremity. RIGHT basilic vein was not visualized. Electronically Signed   By: Lavonia Dana M.D.   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  1.  Cardiorespiratory arrest after PM placement -pt underwent CPR and ACLS protocol with intubation---now extubated -HR and BP stable -Planned for patient to go back for wire repositioning Possible   2. Syncope with significant orthostasis: Patient with systolic  dysfunction now,  Patient's history reviewed by EP cardiologist Dr. Kennyth Arnold and follow  3. Malignant carcinoid tumor of the stomach causing orthostasis and flushing Spoke with Dr. Grayland Ormond. Patient currently is getting subcutaneous shots for chemotherapy. She is due for a shot however since the chemotherapy medication as outpatient will reschedule it at a later date.   4. possible pneumonia with bilateral pleural effusions Continue IV anabiotic Continue diuresis  5. Hypothyroidism: Continue Synthroid  6. Depression: Continue Prozac  7. Upper extremity swelling will obtain a Doppler of the arms Doppler of the upper extremity shows no DVT however lower extremity she does have DVT however patient having hemoptysis at this point not a candidate for IV anticoagulation full dose, has had a CT scan with negative for pulmonary embolism Will monitor for now high risk for any type of intervention at this point  Case discussed with Care Management/Social Worker. Management plans discussed with the patient, family and they are in agreement.  CODE STATUS: full  DVT Prophylaxis: lovenox  TOTAL TIME TAKING CARE OF THIS PATIENT: 25 minutes.  >50% time spent on counselling and coordination of care pt, husband   POSSIBLE D/C IN *1-2* DAYS, DEPENDING ON CLINICAL CONDITION.  Note: This dictation was prepared with Dragon dictation along with smaller phrase technology. Any transcriptional errors that result from this process are unintentional.  Dustin Flock M.D on 07/13/2017 at 2:35 PM  Between 7am to 6pm - Pager - [phone]  After 6pm go to [email or web address] - Proofreader  [facility]  Office  [phone]  CC: Primary care physician; Gennette Pac, NP

## 2017-07-13 NOTE — Progress Notes (Signed)
Speech Language Pathology Dysphagia Treatment Patient Details Name: JULIENNE VOGLER MRN: [id number] DOB: 1946-11-28 Today's Date: 07/13/2017 Time: 2111-5520 SLP Time Calculation (min) (ACUTE ONLY): 21 min  Assessment / Plan / Recommendation Clinical Impression   pt presents with a mild risk for aspiration  With current diet of soft with thin liquids. Pt with poor po intake. SLP educated on nutritional needs and need to make choices for softer foods to ensure nutritional intake is adequate. Pt with no overt ssx aspiration with soft with thin diet, however was noted to have lengthy mastication and increased a-p transit. Pt with no complaints of current recommended diet. slp will continue to follow up and possible sign off if no new concerns are present.     Diet Recommendation    soft with thin via cup or straw   SLP Plan   Continue with current plan and assess for sign off.  Pertinent Vitals/Pain No pain reported   Swallowing Goals     General Behavior/Cognition: Alert;Cooperative;Pleasant mood Patient Positioning: Upright in bed Oral care provided: N/A  Oral Cavity - Oral Hygiene     Dysphagia Treatment Treatment Methods: Skilled observation;Upgraded PO texture trial;Patient/caregiver education Patient observed directly with PO's: Yes Feeding: Able to feed self Liquids provided via: Teaspoon;Cup;Straw Oral Phase Signs & Symptoms: Prolonged mastication Pharyngeal Phase Signs & Symptoms: Audible swallow Amount of cueing: Independent   GO     Tonita Phoenix 07/13/2017, 2:28 PM

## 2017-07-13 NOTE — Progress Notes (Signed)
Per Dr. Posey Pronto, okay to remove central line. Tanya Wells

## 2017-07-13 NOTE — Care Management Important Message (Signed)
Important Message  Patient Details  Name: Tanya Wells MRN: [id number] Date of Birth: 04/17/1946   Medicare Important Message Given:  Yes    Katrina Stack, RN 07/13/2017, 12:17 PM

## 2017-07-13 NOTE — Progress Notes (Signed)
Chaplain visited patient and family, while on rounds. Provided prayer and prayer shawl, as requested. Also provided ministry of presence and words of encouragement.    07/13/17 1600  Clinical Encounter Type  Visited With Patient;Patient and family together  Visit Type Follow-up;Spiritual support  Referral From Chaplain  Consult/Referral To Chaplain  Spiritual Encounters  Spiritual Needs Prayer;Other (Comment)

## 2017-07-13 NOTE — Progress Notes (Addendum)
Patient Name: Tanya Wells Date of Encounter: 07/13/2017  Primary Cardiologist: End  Hospital Problem List     Principal Problem:   Syncope Active Problems:   Carcinoid tumor   Elevated troponin   Orthostatic hypotension   Sinus pause   Tachycardia-bradycardia syndrome ([place])   Encounter for central line placement   Cardiac arrest ([place])   Respiratory failure ([place])   Acute systolic heart failure (H[facility])     Subjective   No acute overnight events. For PPM lead repositioning this morning. Potassium improving. Mild hemoptysis. SOB stable.   3.5 L negative yesterday with mild improvement in her respiratory status BMP stable, no changes, potassium normal with repletion  Oxygen down to 91% with shortness of breath early this morning  Inpatient Medications    Scheduled Meds: . atorvastatin  20 mg Per Tube q1800  . chlorhexidine gluconate (MEDLINE KIT)  15 mL Mouth Rinse BID  . dorzolamide-timolol  1 drop Both Eyes BID  . enoxaparin (LOVENOX) injection  40 mg Subcutaneous Q24H  . furosemide  40 mg Intravenous Q8H  . guaiFENesin  600 mg Oral BID  . ipratropium-albuterol  3 mL Nebulization TID  . latanoprost  1 drop Both Eyes QHS  . levothyroxine  125 mcg Oral QAC breakfast  . potassium chloride  40 mEq Oral BID   Continuous Infusions: . azithromycin Stopped (07/13/17 1149)  . cefTRIAXone (ROCEPHIN)  IV Stopped (07/13/17 1021)   PRN Meds: acetaminophen **OR** acetaminophen, bisacodyl, docusate, fentaNYL (SUBLIMAZE) injection, fentaNYL (SUBLIMAZE) injection, guaiFENesin-dextromethorphan, [DISCONTINUED] ondansetron **OR** ondansetron (ZOFRAN) IV, sodium chloride flush   Vital Signs    Vitals:   07/13/17 0730 07/13/17 1049 07/13/17 1413 07/13/17 1441  BP:  122/60  118/68  Pulse:  60  (!) 59  Resp:  (!) 21    Temp:  97.9 F (36.6 C)    TempSrc:  Oral    SpO2: 100% 99% 99%   Weight:      Height:        Intake/Output Summary (Last 24 hours) at 07/13/17 1946 Last  data filed at 07/13/17 1400  Gross per 24 hour  Intake              650 ml  Output             1700 ml  Net            -1050 ml   Filed Weights   06/26/2017 1026 06/20/2017 1630 07/13/2017 1207  Weight: 127 lb (57.6 kg) 132 lb 8 oz (60.1 kg) 132 lb (59.9 kg)    Physical Exam     GEN: Well nourished, well developed, in no acute distress.  HEENT: Grossly normal.  Neck: Supple, no JVD, carotid bruits, or masses. Cardiac: RRR, no murmurs, rubs, or gallops. No clubbing, cyanosis,  1+ edema lower extremities.  Radials/DP/PT 2+ and equal bilaterally.  Respiratory:  Diminished breath sounds at the bases bilaterally with bibasilar crackles. GI: Soft, nontender, nondistended, BS + x 4. MS: No deformity or atrophy. Ecchymosis and STS of the right wrist/hand. Right radial pulse 2+. Skin: warm and dry, no rash. Neuro:  Strength and sensation are intact. Psych: AAOx3.  Normal affect.  Labs    CBC  Recent Labs  07/11/2017 0411 07/13/17 0401  WBC 13.7* 10.4  HGB 10.2* 10.1*  HCT 30.6* 30.3*  MCV 87.8 85.8  PLT 119* 992*   Basic Metabolic Panel  Recent Labs  06/25/2017 0411 07/13/17 0401  NA 136 139  K 3.5 4.0  CL 101 100*  CO2 28 31  GLUCOSE 125* 141*  BUN 12 12  CREATININE 0.64 0.69  CALCIUM 7.7* 8.1*   Liver Function Tests No results for input(s): AST, ALT, ALKPHOS, BILITOT, PROT, ALBUMIN in the last 72 hours. No results for input(s): LIPASE, AMYLASE in the last 72 hours. Cardiac Enzymes  Recent Labs  07/15/2017 2229 07/11/17 0806  TROPONINI 0.91* 0.68*   BNP Invalid input(s): POCBNP D-Dimer No results for input(s): DDIMER in the last 72 hours. Hemoglobin A1C No results for input(s): HGBA1C in the last 72 hours. Fasting Lipid Panel No results for input(s): CHOL, HDL, LDLCALC, TRIG, CHOLHDL, LDLDIRECT in the last 72 hours. Thyroid Function Tests No results for input(s): TSH, T4TOTAL, T3FREE, THYROIDAB in the last 72 hours.  Invalid input(s): FREET3  Telemetry     Paced - Personally Reviewed  ECG    n/a - Personally Reviewed  Radiology    US Venous Img Lower Unilateral Right  Result Date: 07/09/2017 CLINICAL DATA:  Acute right lower extremity swelling. EXAM: Right LOWER EXTREMITY VENOUS DOPPLER ULTRASOUND TECHNIQUE: Gray-scale sonography with graded compression, as well as color Doppler and duplex ultrasound were performed to evaluate the lower extremity deep venous systems from the level of the common femoral vein and including the common femoral, femoral, profunda femoral, popliteal and calf veins including the posterior tibial, peroneal and gastrocnemius veins when visible. The superficial great saphenous vein was also interrogated. Spectral Doppler was utilized to evaluate flow at rest and with distal augmentation maneuvers in the common femoral, femoral and popliteal veins. COMPARISON:  Ultrasound of July 05, 2017. FINDINGS: Contralateral Common Femoral Vein: Respiratory phasicity is normal and symmetric with the symptomatic side. No evidence of thrombus. Normal compressibility. Common Femoral Vein: No evidence of thrombus. Normal compressibility, respiratory phasicity and response to augmentation. Saphenofemoral Junction: There appears to be nonocclusive thrombus in the right greater saphenous vein near its junction with the femoral vein, but does not extend into the right common femoral vein. Profunda Femoral Vein: No evidence of thrombus. Normal compressibility and flow on color Doppler imaging. Femoral Vein: No evidence of thrombus. Normal compressibility, respiratory phasicity and response to augmentation. Popliteal Vein: No evidence of thrombus. Normal compressibility, respiratory phasicity and response to augmentation. Calf Veins: No evidence of thrombus. Normal compressibility and flow on color Doppler imaging. Superficial Great Saphenous Vein: As described above, nonocclusive thrombus is seen near the saphenofemoral junction. Venous Reflux:  None.  Other Findings:  None. IMPRESSION: Nonocclusive thrombus seen in the right greater saphenous vein near the saphenofemoral junction, but not seen to extend into the right common femoral vein. Given the proximity of the thrombus to the saphenofemoral junction, this is considered deep venous thrombosis. No other thrombus is noted. These results will be called to the ordering clinician or representative by the Radiologist Assistant, and communication documented in the PACS or zVision Dashboard. Electronically Signed   By: Marijo Conception, M.D.   On: 07/06/2017 13:42   US Venous Img Upper Uni Right  Result Date: 07/02/2017 CLINICAL DATA:  RIGHT upper extremity swelling EXAM: RIGHT UPPER EXTREMITY VENOUS DOPPLER ULTRASOUND TECHNIQUE: Gray-scale sonography with graded compression, as well as color Doppler and duplex ultrasound were performed to evaluate the upper extremity deep venous system from the level of the subclavian vein and including the jugular, axillary, basilic, radial, ulnar and upper cephalic vein. Spectral Doppler was utilized to evaluate flow at rest and with distal augmentation maneuvers. COMPARISON:  None FINDINGS: Contralateral  Subclavian Vein: Respiratory phasicity is normal and symmetric with the symptomatic side. No evidence of thrombus. Normal compressibility. Internal Jugular Vein: No evidence of thrombus. Normal compressibility, respiratory phasicity and response to augmentation. Subclavian Vein: No evidence of thrombus. Normal compressibility, respiratory phasicity and response to augmentation. Axillary Vein: No evidence of thrombus. Normal compressibility, respiratory phasicity and response to augmentation. Cephalic Vein: No evidence of thrombus in the visualized proximal portion. Distal portion is not visualized. Normal compressibility, respiratory phasicity and response to augmentation. Basilic Vein: Not seen Brachial Veins: No evidence of thrombus. Normal compressibility, respiratory  phasicity and response to augmentation. Radial Veins: No evidence of thrombus. Normal compressibility, respiratory phasicity and response to augmentation. Ulnar Veins: No evidence of thrombus. Normal compressibility, respiratory phasicity and response to augmentation. Venous Reflux:  None visualized. Other Findings:  Subcutaneous edema at wrist. IMPRESSION: No evidence of DVT within the RIGHT upper extremity. RIGHT basilic vein was not visualized. Electronically Signed   By: Lavonia Dana M.D.   On: 07/05/2017 16:12    Cardiac Studies   TTE 06/22/2017: Study Conclusions  - Left ventricle: Wall thickness was increased in a pattern of   moderate LVH. Systolic function was severely reduced. The   estimated ejection fraction was in the range of 25% to 30%. - Pericardium, extracardiac: Features were not consistent with   tamponade physiology.  TTE 07/06/2017: Study Conclusions  - Left ventricle: The cavity size was normal. Wall thickness was   normal. Systolic function was normal. The estimated ejection   fraction was in the range of 60% to 65%. Wall motion was normal;   there were no regional wall motion abnormalities. Left   ventricular diastolic function parameters were normal. - Mitral valve: There was mild regurgitation. - Pulmonary arteries: Systolic pressure was mildly increased. PA   peak pressure: 38 mm Hg (S).  Patient Profile     71 y.o. female with history of metastatic carcinoid tumor of the stomach, followed by Dr. Grayland Ormond, MD, HLD, hypothyroidism, liver cysts, GERD and depression who presented to [facility] on 7/22 with syncope. Found to have intermittent 3rd degree AV block. Post procedure course complicated by cardiac arrest on 7/24 withagonal breathing, unresponsive with no palpable pulse. CPR was started for approximately 15-20 minutes. Reports of VF that transitioned to PEA prior to defibrillation. She was given 3 rounds of Epi with continued CPR. She was intubated. ROSC was  obtained ~ 20 minutes into CPR. Stat bedside echo  did not show pericardial effusion. Pressors briefly given to maintain MAP > 65. Telemetry showed initial pacing followed by some ectopy. Heart rate up to 155 bpm after epinephrine given. Head CT scan unrevealing. CTA chest negative for PE. Preliminary EF of 20%, possible stress-induced. Extubated 7/25. Given events from above pacer lead has dislodged.  Assessment & Plan     --- Acute on chronic systolic CHF Acute drop in her LV function this admission, etiology still unclear Pleural effusion likely secondary to systolic CHF Received Lasix 40 IV every 8 yesterday with 3.5 L out We'll continue every 8 dosing today Consider decreasing down to twice a day dosing on Saturday Close monitoring of renal function, electrolytes Appears to be tolerating well so far with normal BMP  --- Syncope Pacemaker placed for pauses by Dr. Josefa Half  Consult placed by Dr. Caryl Comes on Thursday Dr. Josefa Half felt lead had dislodged and was considering adjustment of pacer wire next week after diuresis  --- Cardiorespiratory arrest Received CPR 20 minutes after pacer placement, acute drop  in her ejection fraction Etiology unclear Minimal climb in her troponin, 0.9 Possible stress cardiomyopathy. Underlying carcinoid Diuresis for now   Greater than 50% was spent in counseling and coordination of care with patient Total encounter time 25 minutes or more   Signed: Esmond Plants  M.D., Ph.D. [facility]

## 2017-07-14 ENCOUNTER — Inpatient Hospital Stay: Payer: Medicare Other

## 2017-07-14 LAB — CBC
HCT: 35 % (ref 35.0–47.0)
Hemoglobin: 11.7 g/dL — ABNORMAL LOW (ref 12.0–16.0)
MCH: 29.7 pg (ref 26.0–34.0)
MCHC: 33.4 g/dL (ref 32.0–36.0)
MCV: 88.7 fL (ref 80.0–100.0)
PLATELETS: 126 10*3/uL — AB (ref 150–440)
RBC: 3.94 MIL/uL (ref 3.80–5.20)
RDW: 15.5 % — ABNORMAL HIGH (ref 11.5–14.5)
WBC: 9 10*3/uL (ref 3.6–11.0)

## 2017-07-14 LAB — BLOOD GAS, ARTERIAL
ACID-BASE DEFICIT: 6.4 mmol/L — AB (ref 0.0–2.0)
BICARBONATE: 18.1 mmol/L — AB (ref 20.0–28.0)
FIO2: 1
LHR: 26 {breaths}/min
MECHANICAL RATE: 26
O2 SAT: 99.9 %
PCO2 ART: 32 mmHg (ref 32.0–48.0)
PEEP: 5 cmH2O
Patient temperature: 37
VT: 500 mL
pH, Arterial: 7.36 (ref 7.350–7.450)
pO2, Arterial: 302 mmHg — ABNORMAL HIGH (ref 83.0–108.0)

## 2017-07-14 LAB — BASIC METABOLIC PANEL
Anion gap: 7 (ref 5–15)
BUN: 13 mg/dL (ref 6–20)
CALCIUM: 8.6 mg/dL — AB (ref 8.9–10.3)
CHLORIDE: 102 mmol/L (ref 101–111)
CO2: 29 mmol/L (ref 22–32)
Creatinine, Ser: 0.57 mg/dL (ref 0.44–1.00)
GFR calc Af Amer: >60 mL/min (ref >60)
GFR calc non Af Amer: >60 mL/min (ref >60)
Glucose, Bld: 132 mg/dL — ABNORMAL HIGH (ref 65–99)
POTASSIUM: 3.9 mmol/L (ref 3.5–5.1)
SODIUM: 138 mmol/L (ref 135–145)

## 2017-07-14 LAB — GLUCOSE, CAPILLARY
GLUCOSE-CAPILLARY: 249 mg/dL — AB (ref 65–99)
Glucose-Capillary: 127 mg/dL — ABNORMAL HIGH (ref 65–99)
Glucose-Capillary: 334 mg/dL — ABNORMAL HIGH (ref 65–99)

## 2017-07-14 LAB — TROPONIN I
TROPONIN I: 2.93 ng/mL — AB (ref <0.03)
Troponin I: 5.41 ng/mL (ref <0.03)

## 2017-07-14 LAB — MRSA PCR SCREENING: MRSA by PCR: NEGATIVE

## 2017-07-14 MED ORDER — HEPARIN (PORCINE) IN NACL 100-0.45 UNIT/ML-% IJ SOLN: 950.0000 [IU]/h | INTRAMUSCULAR | Status: DC

## 2017-07-14 MED ORDER — EPINEPHRINE PF 1 MG/ML IJ SOLN
1.0000 mg | INTRAMUSCULAR | Status: AC
  Administered 2017-07-14: 1 mg via INTRAVENOUS

## 2017-07-14 MED ORDER — GLYCOPYRROLATE 1 MG PO TABS: 1.0000 mg | ORAL_TABLET | ORAL | Status: DC | PRN

## 2017-07-14 MED ORDER — GLYCOPYRROLATE 0.2 MG/ML IJ SOLN: 0.2000 mg | INTRAMUSCULAR | Status: DC | PRN

## 2017-07-14 MED ORDER — MORPHINE SULFATE (PF) 10 MG/ML IV SOLN: 5.0000 mg | INTRAVENOUS | Status: DC | PRN

## 2017-07-14 MED ORDER — POLYVINYL ALCOHOL 1.4 % OP SOLN
1.0000 [drp] | Freq: Four times a day (QID) | OPHTHALMIC | Status: DC | PRN
  Filled 2017-07-14: qty 15

## 2017-07-14 MED ORDER — NOREPINEPHRINE BITARTRATE 1 MG/ML IV SOLN
0.0000 ug/min | INTRAVENOUS | Status: DC
  Administered 2017-07-14: 20 ug/min via INTRAVENOUS
  Administered 2017-07-14 (×3): 60 ug/min via INTRAVENOUS
  Administered 2017-07-14: 30 ug/min via INTRAVENOUS
  Filled 2017-07-14 (×4): qty 4

## 2017-07-14 MED ORDER — MORPHINE SULFATE (PF) 4 MG/ML IV SOLN: 5.0000 mg | INTRAVENOUS | Status: DC | PRN

## 2017-07-14 MED ORDER — HEPARIN BOLUS VIA INFUSION
3000.0000 [IU] | Freq: Once | INTRAVENOUS | Status: DC
  Filled 2017-07-14: qty 3000

## 2017-07-14 MED ORDER — BIOTENE DRY MOUTH MT LIQD: 15.0000 mL | OROMUCOSAL | Status: DC | PRN

## 2017-07-14 MED ORDER — DOBUTAMINE IN D5W 4-5 MG/ML-% IV SOLN
2.5000 ug/kg/min | INTRAVENOUS | Status: DC
  Administered 2017-07-14: 2.5 ug/kg/min via INTRAVENOUS
  Filled 2017-07-14: qty 250

## 2017-07-14 MED ORDER — MORPHINE SULFATE (PF) 4 MG/ML IV SOLN
INTRAVENOUS | Status: AC
  Administered 2017-07-14: 4 mg
  Filled 2017-07-14: qty 1

## 2017-07-14 MED ORDER — FENTANYL 2500MCG IN NS 250ML (10MCG/ML) PREMIX INFUSION
25.0000 ug/h | INTRAVENOUS | Status: DC
  Administered 2017-07-14 (×2): 50 ug/h via INTRAVENOUS
  Filled 2017-07-14: qty 250

## 2017-07-14 MED ORDER — NOREPINEPHRINE BITARTRATE 1 MG/ML IV SOLN
0.0000 ug/min | INTRAVENOUS | Status: DC
  Filled 2017-07-14: qty 4

## 2017-07-14 MED ORDER — NOREPINEPHRINE BITARTRATE 1 MG/ML IV SOLN
0.0000 ug/min | INTRAVENOUS | Status: DC
  Administered 2017-07-14: 60 ug/min via INTRAVENOUS
  Filled 2017-07-14 (×2): qty 16

## 2017-07-14 MED ORDER — SODIUM CHLORIDE 0.9 % IV SOLN
0.0300 [IU]/min | INTRAVENOUS | Status: DC
  Administered 2017-07-14: 0.03 [IU]/min via INTRAVENOUS
  Filled 2017-07-14: qty 2

## 2017-07-14 MED ORDER — EPINEPHRINE PF 1 MG/10ML IJ SOSY
0.5000 mg | PREFILLED_SYRINGE | Freq: Once | INTRAMUSCULAR | Status: AC
  Administered 2017-07-14: 0.5 mg via INTRAVENOUS

## 2017-07-14 MED ORDER — EPINEPHRINE PF 1 MG/ML IJ SOLN
0.5000 ug/min | INTRAVENOUS | Status: DC
  Administered 2017-07-14: 1 ug/min via INTRAVENOUS
  Filled 2017-07-14 (×2): qty 4

## 2017-07-14 MED ORDER — HEPARIN SODIUM (PORCINE) 5000 UNIT/ML IJ SOLN: 5000.0000 [IU] | Freq: Three times a day (TID) | INTRAMUSCULAR | Status: DC

## 2017-07-15 MED FILL — Medication: Qty: 1 | Status: AC

## 2017-07-17 ENCOUNTER — Telehealth: Payer: Self-pay | Admitting: Pulmonary Disease

## 2017-07-17 NOTE — Telephone Encounter (Signed)
Death certificate placed in DK folder for completion. 

## 2017-07-17 NOTE — Telephone Encounter (Signed)
Recieved Death Certificate from [place]. Placed on Tanya Wells, [place] desk

## 2017-07-17 NOTE — Telephone Encounter (Signed)
Tanya Wells and [facility] informed death cert is ready for pick up. Nothing further needed.

## 2017-07-18 NOTE — Progress Notes (Signed)
ANTICOAGULATION CONSULT NOTE - Initial Consult  Pharmacy Consult for heparin drip Indication: pulmonary embolus  Allergies  Allergen Reactions  . Codeine Nausea Only    Patient Measurements: Height: 4\' 11"  (149.9 cm) Weight: 132 lb (59.9 kg) IBW/kg (Calculated) : 43.2 Heparin Dosing Weight: 56 kg  Vital Signs: Temp: 97.8 F (36.6 C) (07/28 0343) Temp Source: Oral (07/28 0343) BP: 99/85 (07/28 1224) Pulse Rate: 62 (07/28 1224)  Labs:  Recent Labs  06/19/2017 0411 07/13/17 0401 07-26-2017 0433  HGB 10.2* 10.1* 11.7*  HCT 30.6* 30.3* 35.0  PLT 119* 119* 126*  CREATININE 0.64 0.69 0.57    Estimated Creatinine Clearance: 52.3 mL/min (by C-G formula based on SCr of 0.57 mg/dL).   Medical History: Past Medical History:  Diagnosis Date  . Anemia   . Depression   . GERD (gastroesophageal reflux disease)   . Hyperlipidemia   . Hypothyroidism   . Liver cyst   . Malignant carcinoid tumor of stomach ([place])   . Pulmonary hypertension ([place])    a. TTE 07/06/17: EF 60-65%, nl WM, nl diastolic fxn, mild MR, PASP 38  . Syncope     Assessment: Pharmacy consulted to dose and monitor heparin in this 71 year old female for a PE. Patient had cardiac arrest today.  Goal of Therapy:  Heparin level 0.3-0.7 units/ml Monitor platelets by anticoagulation protocol: Yes   Plan:  Give 3000 units bolus x 1 Start heparin infusion at 950 units/hr Check anti-Xa level in 6 hours and daily while on heparin Continue to monitor H&H and platelets  Lenis Noon, PharmD Clinical Pharmacist 2017-07-26,2:01 PM

## 2017-07-18 NOTE — Progress Notes (Signed)
Tanya Wells at [place] NAME: Tanya Wells    MR#:  [id number]  DATE OF BIRTH:  1946/04/28  SUBJECTIVE:  Pt this morning feeling better, sob improved accordingly to her, Swelling in right upper ext swelling less  REVIEW OF SYSTEMS:   Review of Systems  Constitutional: Negative for chills, fever and weight loss.  HENT: Negative for ear discharge, ear pain and nosebleeds.   Eyes: Negative for blurred vision, pain and discharge.  Respiratory: Positive for cough and hemoptysis. Negative for sputum production, shortness of breath, wheezing and stridor.   Cardiovascular: Negative for chest pain, palpitations, orthopnea, leg swelling and PND.  Gastrointestinal: Negative for abdominal pain, diarrhea, nausea and vomiting.  Genitourinary: Negative for frequency and urgency.  Musculoskeletal: Negative for back pain and joint pain.  Neurological: Negative for sensory change, speech change, focal weakness and weakness.  Psychiatric/Behavioral: Negative for depression and hallucinations. The patient is not nervous/anxious.    Tolerating Diet:yes Tolerating PT: pending  DRUG ALLERGIES:   Allergies  Allergen Reactions  . Codeine Nausea Only    VITALS:  Blood pressure 99/85, pulse 62, temperature 97.8 F (36.6 C), temperature source Oral, resp. rate (!) 26, height 4\' 11"  (1.499 m), weight 132 lb (59.9 kg), SpO2 (!) 80 %.  PHYSICAL EXAMINATION:   Physical Exam  GENERAL:  71 y.o.-year-old patient lying in the bed Chronically ill-appearing  EYES: Pupils equal, round, reactive to light and accommodation. No scleral icterus. Extraocular muscles intact.  HEENT: Head atraumatic, normocephalic. Oropharynx and nasopharynx clear.  NECK:  Supple, no jugular venous distention. No thyroid enlargement, no tenderness.  LUNGS:Rhonchus breath sounds within both lungs, dressing from pacemaker present  CARDIOVASCULAR: S1, S2 normal. No murmurs, rubs, or  gallops.  ABDOMEN: Soft, nontender, nondistended. Bowel sounds present. No organomegaly or mass.  EXTREMITIES: Right upper extremity swelling less from her hand down to her shoulder    NEUROLOGIC: Cranial nerves II through XII are intact. No focal Motor or sensory deficits b/l.   PSYCHIATRIC:  patient is alert and oriented x 3.  SKIN: No obvious rash, lesion, or ulcer.   LABORATORY PANEL:  CBC  Recent Labs Lab 07/22/17 0433  WBC 9.0  HGB 11.7*  HCT 35.0  PLT 126*    Chemistries   Recent Labs Lab 07/09/17 0518 06/25/2017 1700  07-22-2017 0433  NA 140 156*  < > 138  K 4.4 3.1*  < > 3.9  CL 112* 97*  < > 102  CO2 22 45*  < > 29  GLUCOSE 117* 209*  < > 132*  BUN 8 9  < > 13  CREATININE 0.77 0.90  < > 0.57  CALCIUM 8.0* 6.5*  < > 8.6*  MG 1.9  --   --   --   AST  --  145*  --   --   ALT  --  113*  --   --   ALKPHOS  --  141*  --   --   BILITOT  --  1.1  --   --   < > = values in this interval not displayed. Cardiac Enzymes  Recent Labs Lab 07/11/17 0806  TROPONINI 0.68*   RADIOLOGY:  US Venous Img Lower Unilateral Right  Result Date: 07/04/2017 CLINICAL DATA:  Acute right lower extremity swelling. EXAM: Right LOWER EXTREMITY VENOUS DOPPLER ULTRASOUND TECHNIQUE: Gray-scale sonography with graded compression, as well as color Doppler and duplex ultrasound were performed to evaluate the lower extremity  deep venous systems from the level of the common femoral vein and including the common femoral, femoral, profunda femoral, popliteal and calf veins including the posterior tibial, peroneal and gastrocnemius veins when visible. The superficial great saphenous vein was also interrogated. Spectral Doppler was utilized to evaluate flow at rest and with distal augmentation maneuvers in the common femoral, femoral and popliteal veins. COMPARISON:  Ultrasound of July 05, 2017. FINDINGS: Contralateral Common Femoral Vein: Respiratory phasicity is normal and symmetric with the symptomatic  side. No evidence of thrombus. Normal compressibility. Common Femoral Vein: No evidence of thrombus. Normal compressibility, respiratory phasicity and response to augmentation. Saphenofemoral Junction: There appears to be nonocclusive thrombus in the right greater saphenous vein near its junction with the femoral vein, but does not extend into the right common femoral vein. Profunda Femoral Vein: No evidence of thrombus. Normal compressibility and flow on color Doppler imaging. Femoral Vein: No evidence of thrombus. Normal compressibility, respiratory phasicity and response to augmentation. Popliteal Vein: No evidence of thrombus. Normal compressibility, respiratory phasicity and response to augmentation. Calf Veins: No evidence of thrombus. Normal compressibility and flow on color Doppler imaging. Superficial Great Saphenous Vein: As described above, nonocclusive thrombus is seen near the saphenofemoral junction. Venous Reflux:  None. Other Findings:  None. IMPRESSION: Nonocclusive thrombus seen in the right greater saphenous vein near the saphenofemoral junction, but not seen to extend into the right common femoral vein. Given the proximity of the thrombus to the saphenofemoral junction, this is considered deep venous thrombosis. No other thrombus is noted. These results will be called to the ordering clinician or representative by the Radiologist Assistant, and communication documented in the PACS or zVision Dashboard. Electronically Signed   By: Marijo Conception, M.D.   On: 07/02/2017 13:42   US Venous Img Upper Uni Right  Result Date: 07/15/2017 CLINICAL DATA:  RIGHT upper extremity swelling EXAM: RIGHT UPPER EXTREMITY VENOUS DOPPLER ULTRASOUND TECHNIQUE: Gray-scale sonography with graded compression, as well as color Doppler and duplex ultrasound were performed to evaluate the upper extremity deep venous system from the level of the subclavian vein and including the jugular, axillary, basilic, radial, ulnar  and upper cephalic vein. Spectral Doppler was utilized to evaluate flow at rest and with distal augmentation maneuvers. COMPARISON:  None FINDINGS: Contralateral Subclavian Vein: Respiratory phasicity is normal and symmetric with the symptomatic side. No evidence of thrombus. Normal compressibility. Internal Jugular Vein: No evidence of thrombus. Normal compressibility, respiratory phasicity and response to augmentation. Subclavian Vein: No evidence of thrombus. Normal compressibility, respiratory phasicity and response to augmentation. Axillary Vein: No evidence of thrombus. Normal compressibility, respiratory phasicity and response to augmentation. Cephalic Vein: No evidence of thrombus in the visualized proximal portion. Distal portion is not visualized. Normal compressibility, respiratory phasicity and response to augmentation. Basilic Vein: Not seen Brachial Veins: No evidence of thrombus. Normal compressibility, respiratory phasicity and response to augmentation. Radial Veins: No evidence of thrombus. Normal compressibility, respiratory phasicity and response to augmentation. Ulnar Veins: No evidence of thrombus. Normal compressibility, respiratory phasicity and response to augmentation. Venous Reflux:  None visualized. Other Findings:  Subcutaneous edema at wrist. IMPRESSION: No evidence of DVT within the RIGHT upper extremity. RIGHT basilic vein was not visualized. Electronically Signed   By: Lavonia Dana M.D.   On: 07/11/2017 16:12   ASSESSMENT AND PLAN:   71 year old female with malignant carcinoid tumor of the stomach on monthly lantreotide injections who presents due to syncope and found to have significant orthostatic hypotension.  1. Cardiorespiratory  arrest after PM placement -pt underwent CPR and ACLS protocol with intubation---now extubated -HR and BP stable -Planned for patient to go back for wire repositioning On Monday   2. Syncope with significant orthostasis: Patient with systolic  dysfunction now,  Patient's history reviewed by EP cardiologist , Dr. Karl Bales has been following.   3. Malignant carcinoid tumor of the stomach  causing orthostasis and flushing Oncology follwoing patient as outpatient. Patient currently is getting subcutaneous shots for chemotherapy. She is due for a shot however since the chemotherapy medication as outpatient will need rescedule   4. possible pneumonia with bilateral pleural effusions Continue IV anabiotic Continue diuresis  5. Hypothyroidism: Continue Synthroid  6. Depression: Continue Prozac  7. Upper extremity swelling will obtain a Doppler of the arms Doppler of the upper extremity shows no DVT however lower extremity she does have DVT however patient having hemoptysis at this point not a candidate for IV anticoagulation full dose, has had a CT scan with negative for pulmonary embolism Will monitor for now high risk for any type of intervention at this point  Case discussed with Care Management/Social Worker. Management plans discussed with the patient, family and they are in agreement.  CODE STATUS: full  DVT Prophylaxis: lovenox  TOTAL TIME TAKING CARE OF THIS PATIENT: 25 minutes.  >50% time spent on counselling and coordination of care pt, husband   POSSIBLE D/C  DEPENDING ON CLINICAL CONDITION.  Note: This dictation was prepared with Dragon dictation along with smaller phrase technology. Any transcriptional errors that result from this process are unintentional.  Tanya Wells M.D on 27-Jul-2017 at 12:49 PM  Between 7am to 6pm - Pager - [phone]  After 6pm go to [email or web address] - Proofreader  [facility]  Office  [phone]  CC: Primary care physician; Gennette Pac, NP

## 2017-07-18 NOTE — Progress Notes (Signed)
[facility] notified of troponin of 2.93.

## 2017-07-18 NOTE — Progress Notes (Addendum)
SUBJECTIVE: Pt with acute respiratory worsening this am followed by cardiac arrest requiring CPR.  Currently intubated and critically ill.   Marland Kitchen atorvastatin  20 mg Per Tube q1800  . chlorhexidine gluconate (MEDLINE KIT)  15 mL Mouth Rinse BID  . dorzolamide-timolol  1 drop Both Eyes BID  . furosemide  40 mg Intravenous Q8H  . guaiFENesin  600 mg Oral BID  . heparin subcutaneous  5,000 Units Subcutaneous Q8H  . ipratropium-albuterol  3 mL Nebulization TID  . latanoprost  1 drop Both Eyes QHS  . levothyroxine  125 mcg Oral QAC breakfast  . potassium chloride  40 mEq Oral BID   . azithromycin Stopped (20-Jul-2017 1202)  . cefTRIAXone (ROCEPHIN)  IV Stopped (2017-07-20 0955)  . DOBUTamine    . epinephrine 1 mcg/min (July 20, 2017 1330)  . fentaNYL infusion INTRAVENOUS 50 mcg/hr (2017/07/20 1436)  . norepinephrine (LEVOPHED) Adult infusion 30 mcg/min (Jul 20, 2017 1444)  . vasopressin (PITRESSIN) infusion - *FOR SHOCK* Stopped (07-20-17 1318)    OBJECTIVE: Physical Exam: Vitals:   July 20, 2017 1218 07/20/2017 1222 07-20-2017 1224 07-20-2017 1400  BP: (!) 54/17 (!) 84/66 99/85 (!) 144/69  Pulse:   62 91  Resp: (!) 23 (!) 26 (!) 26 (!) 26  Temp:    98.7 F (37.1 C)  TempSrc:    Axillary  SpO2:   (!) 80% 100%  Weight:      Height:        Intake/Output Summary (Last 24 hours) at 20-Jul-2017 1502 Last data filed at 20-Jul-2017 0900  Gross per 24 hour  Intake                0 ml  Output             3700 ml  Net            -3700 ml    Telemetry reveals sinus tachycardia  GEN- The patient is in extremis, intubated Head- normocephalic, atraumatic Oropharynx- ETT in place Neck- supple, + JVD Lungs- Coarse BS Heart- tachycardic regular rhythm GI- soft, NT, ND, + BS Extremities- no clubbing, cyanosis, or edema Skin- no rash or lesion Psych- not interactive Neuro- sedated on vent  LABS: Basic Metabolic Panel:  Recent Labs  07/13/17 0401 07-20-17 0433  NA 139 138  K 4.0 3.9  CL 100* 102  CO2  31 29  GLUCOSE 141* 132*  BUN 12 13  CREATININE 0.69 0.57  CALCIUM 8.1* 8.6*   CBC:  Recent Labs  07/13/17 0401 20-Jul-2017 0433  WBC 10.4 9.0  HGB 10.1* 11.7*  HCT 30.3* 35.0  MCV 85.8 88.7  PLT 119* 126*   RADIOLOGY: Ct Head Wo Contrast  Result Date: 06/17/2017 CLINICAL DATA:  71 year old female post code blue. Down for 15 minutes. Initial encounter. EXAM: CT HEAD WITHOUT CONTRAST TECHNIQUE: Contiguous axial images were obtained from the base of the skull through the vertex without intravenous contrast. COMPARISON:  07/05/2017. FINDINGS: Brain: No intracranial hemorrhage or CT evidence of large acute infarct. Currently no definitive evidence of global hypoxia. This may present in a delayed fashion and possibility can be evaluated on follow-up depending on the patient's clinical status. Mild global atrophy. No intracranial mass lesion noted on this unenhanced exam. Vascular: Vascular calcification Skull: No acute abnormality Sinuses/Orbits: No acute orbital abnormality. Visualized lungs clear. Other: Mastoid air cells and middle ear cavities clear. IMPRESSION: Currently no CT evidence to suggest global hypoxia. If the patient has clinical findings to suggest such, this could be further  evaluated on follow-up CT as CT evidence of hypoxia may present in a delayed fashion. No intracranial hemorrhage or CT evidence of large acute infarct. Electronically Signed   By: Genia Del M.D.   On: 07/11/2017 19:10   Ct Angio Chest Pe W Or Wo Contrast  Result Date: 06/19/2017 CLINICAL DATA:  Post code after pacemaker EXAM: CT ANGIOGRAPHY CHEST WITH CONTRAST TECHNIQUE: Multidetector CT imaging of the chest was performed using the standard protocol during bolus administration of intravenous contrast. Multiplanar CT image reconstructions and MIPs were obtained to evaluate the vascular anatomy. CONTRAST:  75 mL Isovue 370 intravenous COMPARISON:  Radiograph 07/07/2017, CT chest 07/05/2017, PET-CT 04/03/2017  FINDINGS: Cardiovascular: Satisfactory opacification of the pulmonary arteries to the segmental level. No evidence of pulmonary embolism. Non aneurysmal aorta. Atherosclerosis is present. Coronary artery calcification. Small amount of iatrogenic in the pulmonary trunk. Left-sided cardiac pacing device with leads in the right atrium and right ventricle. Borderline cardiomegaly. Small pericardial effusion. Mediastinum/Nodes: No thyroid mass. Endotracheal tube tip terminates about a cm above the carina. Nonspecific mediastinal lymph nodes, slightly increased compared to prior study. Esophagus unremarkable Lungs/Pleura: Small to moderate bilateral pleural effusions. Dense consolidation within the bilateral lower lobes. Ground-glass density within the right upper lobe with patchy ground-glass density in the right middle lobe. 4 mm left upper lobe pulmonary nodule, series 7, image number 23. Oval nodular density subpleural left upper lobe series 7, image number 32. No pneumothorax. Upper Abdomen: Known hepatic metastatic disease is not well visualized on today's study due to timing of contrast bolus. Musculoskeletal: No acute or suspicious bone lesions are seen. Review of the MIP images confirms the above findings. IMPRESSION: 1. Negative for acute pulmonary embolus 2. Small to moderate bilateral pleural effusions with dense bilateral lower lobe consolidations which may reflect atelectasis, pneumonia or aspiration. Ground-glass density in the right upper and middle lobes may reflect asymmetric edema or possibly infection. 3. 4 mm left upper lobe pulmonary nodule, probably infectious or inflammatory given time course of development. 4. Known hepatic metastatic disease not well appreciated on the current study. Aortic Atherosclerosis (ICD10-I70.0). Electronically Signed   By: Donavan Foil M.D.   On: 07/15/2017 19:23   Ct Angio Chest Pe W Or Wo Contrast  Result Date: 07/05/2017 CLINICAL DATA:  Dyspnea and tachycardia.  Elevated white blood cell count. History of gastric carcinoid tumor. EXAM: CT ANGIOGRAPHY CHEST WITH CONTRAST TECHNIQUE: Multidetector CT imaging of the chest was performed using the standard protocol during bolus administration of intravenous contrast. Multiplanar CT image reconstructions and MIPs were obtained to evaluate the vascular anatomy. CONTRAST:  70 mL Isovue 370 IV COMPARISON:  1. Chest radiograph 07/05/2017 2. PET CT 04/03/2017 3. CT chest 03/07/2017 FINDINGS: Cardiovascular: Contrast injection is sufficient to demonstrate satisfactory opacification of the pulmonary arteries to the segmental level. There is no pulmonary embolus. The main pulmonary artery is within normal limits for size. There is no CT evidence of acute right heart strain. The visualized aorta is normal. There is a normal 3-vessel arch branching pattern. Heart size is normal, without pericardial effusion. There is atherosclerotic calcification in the coronary arteries. Mediastinum/Nodes: No mediastinal, hilar or axillary lymphadenopathy. The visualized thyroid and thoracic esophageal course are unremarkable. Lungs/Pleura: No pulmonary nodules or masses. No pleural effusion or pneumothorax. No focal airspace consolidation. No focal pleural abnormality. Upper Abdomen: Contrast bolus timing is not optimized for evaluation of the abdominal organs. Known hepatic metastases are not well characterized on this study. Musculoskeletal: No chest wall abnormality.  No acute or significant osseous findings. Review of the MIP images confirms the above findings. IMPRESSION: 1. No pulmonary embolus. 2. No other acute thoracic abnormality. 3. Hepatic metastases demonstrated on prior PET CT are not visible on this study. Electronically Signed   By: Ulyses Jarred M.D.   On: 07/05/2017 22:26   US Carotid Bilateral  Result Date: 07/09/2017 CLINICAL DATA:  71 year old female with syncope EXAM: BILATERAL CAROTID DUPLEX ULTRASOUND TECHNIQUE: Pearline Cables scale  imaging, color Doppler and duplex ultrasound were performed of bilateral carotid and vertebral arteries in the neck. COMPARISON:  None. FINDINGS: Criteria: Quantification of carotid stenosis is based on velocity parameters that correlate the residual internal carotid diameter with NASCET-based stenosis levels, using the diameter of the distal internal carotid lumen as the denominator for stenosis measurement. The following velocity measurements were obtained: RIGHT ICA:  55/14 cm/sec CCA:  03/47 cm/sec SYSTOLIC ICA/CCA RATIO:  0.9 DIASTOLIC ICA/CCA RATIO:  4.25 ECA:  86 cm/sec LEFT ICA:  58/15 cm/sec CCA:  95/63 cm/sec SYSTOLIC ICA/CCA RATIO:  0.9 DIASTOLIC ICA/CCA RATIO:  1.2 ECA:  55 cm/sec RIGHT CAROTID ARTERY: Mild focal heterogeneous atherosclerotic plaque in the proximal internal carotid artery. By peak systolic velocity criteria, the estimated stenosis remains less than 50%. RIGHT VERTEBRAL ARTERY:  Patent with normal antegrade flow. LEFT CAROTID ARTERY: No significant atherosclerotic plaque or evidence of stenosis. LEFT VERTEBRAL ARTERY:  Patent with normal antegrade flow. IMPRESSION: 1. Mild (1-49%) stenosis proximal right internal carotid artery secondary to focal heterogeneous atherosclerotic plaque. 2. No significant atherosclerotic plaque or evidence of stenosis in the left internal carotid artery. 3. Vertebral arteries are patent with antegrade flow. Signed, Criselda Peaches, MD [facility] Radiology Electronically Signed   By: Jacqulynn Cadet M.D.   On: 07/09/2017 18:23   US Venous Img Lower Unilateral Right  Result Date: 07/09/2017 CLINICAL DATA:  Acute right lower extremity swelling. EXAM: Right LOWER EXTREMITY VENOUS DOPPLER ULTRASOUND TECHNIQUE: Gray-scale sonography with graded compression, as well as color Doppler and duplex ultrasound were performed to evaluate the lower extremity deep venous systems from the level of the common femoral  vein and including the common femoral, femoral, profunda femoral, popliteal and calf veins including the posterior tibial, peroneal and gastrocnemius veins when visible. The superficial great saphenous vein was also interrogated. Spectral Doppler was utilized to evaluate flow at rest and with distal augmentation maneuvers in the common femoral, femoral and popliteal veins. COMPARISON:  Ultrasound of July 05, 2017. FINDINGS: Contralateral Common Femoral Vein: Respiratory phasicity is normal and symmetric with the symptomatic side. No evidence of thrombus. Normal compressibility. Common Femoral Vein: No evidence of thrombus. Normal compressibility, respiratory phasicity and response to augmentation. Saphenofemoral Junction: There appears to be nonocclusive thrombus in the right greater saphenous vein near its junction with the femoral vein, but does not extend into the right common femoral vein. Profunda Femoral Vein: No evidence of thrombus. Normal compressibility and flow on color Doppler imaging. Femoral Vein: No evidence of thrombus. Normal compressibility, respiratory phasicity and response to augmentation. Popliteal Vein: No evidence of thrombus. Normal compressibility, respiratory phasicity and response to augmentation. Calf Veins: No evidence of thrombus. Normal compressibility and flow on color Doppler imaging. Superficial Great Saphenous Vein: As described above, nonocclusive thrombus is seen near the saphenofemoral junction. Venous Reflux:  None. Other Findings:  None. IMPRESSION: Nonocclusive thrombus seen in the right greater saphenous vein near the saphenofemoral junction, but not seen to extend into the right common femoral vein. Given  the proximity of the thrombus to the saphenofemoral junction, this is considered deep venous thrombosis. No other thrombus is noted. These results will be called to the ordering clinician or representative by the Radiologist Assistant, and communication documented in the  PACS or zVision Dashboard. Electronically Signed   By: Marijo Conception, M.D.   On: 06/27/2017 13:42   US Venous Img Upper Uni Right  Result Date: 07/01/2017 CLINICAL DATA:  RIGHT upper extremity swelling EXAM: RIGHT UPPER EXTREMITY VENOUS DOPPLER ULTRASOUND TECHNIQUE: Gray-scale sonography with graded compression, as well as color Doppler and duplex ultrasound were performed to evaluate the upper extremity deep venous system from the level of the subclavian vein and including the jugular, axillary, basilic, radial, ulnar and upper cephalic vein. Spectral Doppler was utilized to evaluate flow at rest and with distal augmentation maneuvers. COMPARISON:  None FINDINGS: Contralateral Subclavian Vein: Respiratory phasicity is normal and symmetric with the symptomatic side. No evidence of thrombus. Normal compressibility. Internal Jugular Vein: No evidence of thrombus. Normal compressibility, respiratory phasicity and response to augmentation. Subclavian Vein: No evidence of thrombus. Normal compressibility, respiratory phasicity and response to augmentation. Axillary Vein: No evidence of thrombus. Normal compressibility, respiratory phasicity and response to augmentation. Cephalic Vein: No evidence of thrombus in the visualized proximal portion. Distal portion is not visualized. Normal compressibility, respiratory phasicity and response to augmentation. Basilic Vein: Not seen Brachial Veins: No evidence of thrombus. Normal compressibility, respiratory phasicity and response to augmentation. Radial Veins: No evidence of thrombus. Normal compressibility, respiratory phasicity and response to augmentation. Ulnar Veins: No evidence of thrombus. Normal compressibility, respiratory phasicity and response to augmentation. Venous Reflux:  None visualized. Other Findings:  Subcutaneous edema at wrist. IMPRESSION: No evidence of DVT within the RIGHT upper extremity. RIGHT basilic vein was not visualized. Electronically Signed    By: Lavonia Dana M.D.   On: 07/06/2017 16:12   Dg Chest Port 1 View  Result Date: 07/11/2017 CLINICAL DATA:  Respiratory failure EXAM: PORTABLE CHEST 1 VIEW COMPARISON:  06/21/2017 FINDINGS: Endotracheal tube remains satisfactorily positioned, 2.6 cm above the carina. Grossly intact transvenous cardiac leads. Probable pleural effusions. Patchy central airspace opacity in the right lung, appearing partially cleared compared to 06/30/2017. IMPRESSION: 1.  Support equipment appears satisfactorily positioned. 2. Partial clearance of central right lung airspace opacity. Persistent pleural effusions. Electronically Signed   By: Andreas Newport M.D.   On: 07/11/2017 05:29   Dg Chest Port 1 View  Result Date: 06/21/2017 CLINICAL DATA:  line placement EXAM: PORTABLE CHEST 1 VIEW COMPARISON:  07/03/2017 FINDINGS: Endotracheal tube tip is approximately 2.6 cm superior to the carina. Left-sided duo lead pacing device as before. Interim development of diffuse ground-glass density throughout the right thorax. Linear scarring on the left. Small right effusion. Normal heart size. No pneumothorax. No definite additional ascending or descending catheters are seen. IMPRESSION: 1. Endotracheal tube tip about 2.6 cm superior to carina 2. Tiny right pleural effusion. Development of diffuse ground-glass density in the right thorax, findings could be secondary to asymmetric edema, diffuse infection, or hemorrhage. Electronically Signed   By: Donavan Foil M.D.   On: 06/24/2017 17:38   Dg Chest Port 1 View  Result Date: 06/23/2017 CLINICAL DATA:  Post pacemaker insertion, history of carcinoid tumor of the stomach EXAM: PORTABLE CHEST 1 VIEW COMPARISON:  CT chest of 07/05/2017 and chest x-ray of the same day FINDINGS: The heart is borderline enlarged and there is some fullness of the perihilar vasculature suggesting mild fluid overload. Tiny  pleural effusions cannot be excluded. Permanent pacemaker is now present. No bony  abnormality is seen. IMPRESSION: 1. Question mild pulmonary vascular congestion. 2. Cannot exclude small pleural effusions. 3. Permanent pacemaker is present. Electronically Signed   By: Ivar Drape M.D.   On: 07/16/2017 13:53   Dg C-arm 1-60 Min-no Report  Result Date: 07/11/2017 Fluoroscopy was utilized by the requesting physician.  No radiographic interpretation.    ASSESSMENT AND PLAN:   1. Cardiac/ PEA arrest Unclear etiology Currently acutely quite ill and hypotensive despite IV levofed and epinephrine infusions. I have spoken with Dr Rockey Situ.  Given recently depressed EF, he is concerned about cardiogenic shock as the cause.  Given recent PPM and concerns for lead dislodgement, would also consider tamponade as a possibility.  Post op echo however did not show tamponade 07/11/17.  I have ordered a stat echo now.  This will also help assess RV as PTE would also be on the differential.  I have spoken with Dr Ashby Dawes who will add heparin drip until she is stable for further PTE evaluation given recent hemoptysis noted on Dr Gwenyth Ober prior note.  Also cycle cardiac markers.  ekg is without acute ST segment changes.  Would also consider carcinoid crisis as a potential cause as she has a h/o carcinoid. Start dobutamine for potential cardiogenic shock. Prognosis is very poor.  DNR conversations with family by Dr Ashby Dawes are noted. Medicine team to evaluate for non cardiac causes of arrest including consideration of carcinoid crisis.  2. Stress induced CM Start dobutamine as above Hold diuresis for now given hypotension  3. Bradycardia, recent PPM implant, concerns for lead dislodgement Do not see notes from Dr Saralyn Pilar other than the operative report regarding indication for pacing or plans for management of potentially dislodged lead. I have reviewed device interrogation from 06/21/2017 which reveals elevated RV lead threshold No CXR since 07/11/17 Echo 07/11/17 states no tamponade but  does not mention whether or not there is an effusion.  I will order stat CXR I have also ordered stat echo to evaluate for effusion/ tamponade Rhythm is stable.  I have not seen AV block on telemetry review  The patient is critically ill with multiple organ systems failure and requires high complexity decision making for assessment and support, frequent evaluation and titration of therapies, application of advanced monitoring technologies and extensive interpretation of multiple databases.   Total CCT spent directly with the patient today is 45 minutes     Thompson Grayer, MD July 30, 2017 3:02 PM

## 2017-07-18 NOTE — Progress Notes (Signed)
Dr. Rayann Heman notified of increased pacing spikes and increased heart rate. No new orders at this time.

## 2017-07-18 NOTE — Progress Notes (Signed)
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 Admitted with syncope with third degree block, status post pacemaker on 6/06, complicated by cardiac arrest downtime of 15-20 minutes. Again developed PEA cardiac arrest on 7/28, transferred back to ICU.   ASSESSMENT/PLAN    PULMONARY A:Intubated during code. P:   Continue vent support.  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 -Now status post cardiac arrest on 7/28, downtime approximately 13 minutes. -Persistent cardiogenic shock with persistent hypotension on maximum doses of Levophed, requiring recurrent doses of epinephrine to maintain blood pressure. P:  The patient has persistent hypotension, responding only to epinephrine. I discussed the situation with the patient's husband and his wife, they are agreeable to DO NOT RESUSCITATE status. I explained that it was very unlikely that she would be able to survive this episode, in addition, she is now unresponsive and is likely sustained significant degree of anoxic injury to the brain and other organs. -Patient's family was agreeable to make the patient DO NOT RESUSCITATE, we will not escalate care, we will continue on current medications, with the expectation that she may pass away.  HEMODYNAMICS:    RENAL A:  -- P:   -- INTAKE / OUTPUT:  Intake/Output Summary (Last 24 hours) at 08-11-2017 1315 Last data filed at 08/11/17 0900  Gross per 24 hour  Intake              600 ml  Output             3700 ml  Net            -3100 ml    GASTROINTESTINAL A:  -- P:   --  HEMATOLOGIC A:  DVT P:  On empiric heparin.   NEUROLOGIC A:  Anoxic brain injury, anoxic  encephalopathy. P:   -Continue to monitor.    ---------------------------------------   ----------------------------------------   Name: Tanya Wells MRN: [id number] DOB: 18-Feb-1946    ADMISSION DATE:  07/07/2017    SUBJECTIVE:   Pt currently on the ventilator, can not provide history or review of systems.   Review of Systems:  --   VITAL SIGNS: Temp:  [97.8 F (36.6 C)] 97.8 F (36.6 C) (07/28 0343) Pulse Rate:  [28-132] 62 (07/28 1224) Resp:  [16-32] 26 (07/28 1224) BP: (42-121)/(17-90) 99/85 (07/28 1224) SpO2:  [80 %-100 %] 80 % (07/28 1224) FiO2 (%):  [100 %] 100 % (07/28 1310)     PHYSICAL EXAMINATION: Physical Examination:   VS: BP 99/85   Pulse 62   Temp 97.8 F (36.6 C) (Oral)   Resp (!) 26   Ht 4\' 11"  (1.499 m)   Wt 132 lb (59.9 kg)   SpO2 (!) 80%   BMI 26.66 kg/m   General Appearance: Unresponsive  Neuro:without focal findings, did not respond to noxious stimuli HEENT: Neck is supple. Pulmonary: normal breath sounds   CardiovascularNormal S1,S2.  No m/r/g.   Abdomen: Benign, Soft, non-tender. Renal:  No costovertebral tenderness  GU:  Not performed at this time. Endocrine: No evident thyromegaly. Skin:   warm, no rashes, no ecchymosis  Extremities: normal, no cyanosis, clubbing.    LABORATORY PANEL:   CBC  Recent  Labs Lab 2017/08/01 0433  WBC 9.0  HGB 11.7*  HCT 35.0  PLT 126*    Chemistries   Recent Labs Lab 07/09/17 0518 07/05/2017 1700  August 01, 2017 0433  NA 140 156*  < > 138  K 4.4 3.1*  < > 3.9  CL 112* 97*  < > 102  CO2 22 45*  < > 29  GLUCOSE 117* 209*  < > 132*  BUN 8 9  < > 13  CREATININE 0.77 0.90  < > 0.57  CALCIUM 8.0* 6.5*  < > 8.6*  MG 1.9  --   --   --   AST  --  145*  --   --   ALT  --  113*  --   --   ALKPHOS  --  141*  --   --   BILITOT  --  1.1  --   --   < > = values in this interval not displayed.   Recent Labs Lab 07/13/2017 1200 07/09/17 0643 07/02/2017 1543 2017-08-01 0738 08/01/2017 1135  01-Aug-2017 1158  GLUCAP 134* 126* 146* 127* 249* 334*    Recent Labs Lab 06/26/2017 1640  PHART 7.49*  PCO2ART 66*  PO2ART 124*    Recent Labs Lab 06/27/2017 1036 07/07/2017 1700  AST 96* 145*  ALT 82* 113*  ALKPHOS 205* 141*  BILITOT 1.0 1.1  ALBUMIN 3.1* 2.0*    Cardiac Enzymes  Recent Labs Lab 07/11/17 0806  TROPONINI 0.68*    RADIOLOGY:  US Venous Img Lower Unilateral Right  Result Date: 07/07/2017 CLINICAL DATA:  Acute right lower extremity swelling. EXAM: Right LOWER EXTREMITY VENOUS DOPPLER ULTRASOUND TECHNIQUE: Gray-scale sonography with graded compression, as well as color Doppler and duplex ultrasound were performed to evaluate the lower extremity deep venous systems from the level of the common femoral vein and including the common femoral, femoral, profunda femoral, popliteal and calf veins including the posterior tibial, peroneal and gastrocnemius veins when visible. The superficial great saphenous vein was also interrogated. Spectral Doppler was utilized to evaluate flow at rest and with distal augmentation maneuvers in the common femoral, femoral and popliteal veins. COMPARISON:  Ultrasound of July 05, 2017. FINDINGS: Contralateral Common Femoral Vein: Respiratory phasicity is normal and symmetric with the symptomatic side. No evidence of thrombus. Normal compressibility. Common Femoral Vein: No evidence of thrombus. Normal compressibility, respiratory phasicity and response to augmentation. Saphenofemoral Junction: There appears to be nonocclusive thrombus in the right greater saphenous vein near its junction with the femoral vein, but does not extend into the right common femoral vein. Profunda Femoral Vein: No evidence of thrombus. Normal compressibility and flow on color Doppler imaging. Femoral Vein: No evidence of thrombus. Normal compressibility, respiratory phasicity and response to augmentation. Popliteal Vein: No evidence of thrombus. Normal compressibility,  respiratory phasicity and response to augmentation. Calf Veins: No evidence of thrombus. Normal compressibility and flow on color Doppler imaging. Superficial Great Saphenous Vein: As described above, nonocclusive thrombus is seen near the saphenofemoral junction. Venous Reflux:  None. Other Findings:  None. IMPRESSION: Nonocclusive thrombus seen in the right greater saphenous vein near the saphenofemoral junction, but not seen to extend into the right common femoral vein. Given the proximity of the thrombus to the saphenofemoral junction, this is considered deep venous thrombosis. No other thrombus is noted. These results will be called to the ordering clinician or representative by the Radiologist Assistant, and communication documented in the PACS or zVision Dashboard. Electronically Signed   By: Sabino Dick  Brooke Bonito, M.D.   On: 07/16/2017 13:42   US Venous Img Upper Uni Right  Result Date: 07/11/2017 CLINICAL DATA:  RIGHT upper extremity swelling EXAM: RIGHT UPPER EXTREMITY VENOUS DOPPLER ULTRASOUND TECHNIQUE: Gray-scale sonography with graded compression, as well as color Doppler and duplex ultrasound were performed to evaluate the upper extremity deep venous system from the level of the subclavian vein and including the jugular, axillary, basilic, radial, ulnar and upper cephalic vein. Spectral Doppler was utilized to evaluate flow at rest and with distal augmentation maneuvers. COMPARISON:  None FINDINGS: Contralateral Subclavian Vein: Respiratory phasicity is normal and symmetric with the symptomatic side. No evidence of thrombus. Normal compressibility. Internal Jugular Vein: No evidence of thrombus. Normal compressibility, respiratory phasicity and response to augmentation. Subclavian Vein: No evidence of thrombus. Normal compressibility, respiratory phasicity and response to augmentation. Axillary Vein: No evidence of thrombus. Normal compressibility, respiratory phasicity and response to augmentation.  Cephalic Vein: No evidence of thrombus in the visualized proximal portion. Distal portion is not visualized. Normal compressibility, respiratory phasicity and response to augmentation. Basilic Vein: Not seen Brachial Veins: No evidence of thrombus. Normal compressibility, respiratory phasicity and response to augmentation. Radial Veins: No evidence of thrombus. Normal compressibility, respiratory phasicity and response to augmentation. Ulnar Veins: No evidence of thrombus. Normal compressibility, respiratory phasicity and response to augmentation. Venous Reflux:  None visualized. Other Findings:  Subcutaneous edema at wrist. IMPRESSION: No evidence of DVT within the RIGHT upper extremity. RIGHT basilic vein was not visualized. Electronically Signed   By: Lavonia Dana M.D.   On: 07/11/2017 16:12        --Marda Stalker, MD.  ICU Pager: [phone] [facility] Office Number: [phone]   08/01/17   Critical Care Attestation.  I have personally obtained a history, examined the patient, evaluated laboratory and imaging results, formulated the assessment and plan and placed orders. The Patient requires high complexity decision making for assessment and support, frequent evaluation and titration of therapies, application of advanced monitoring technologies and extensive interpretation of multiple databases. The patient has critical illness that could lead imminently to failure of 1 or more organ systems and requires the highest level of physician preparedness to intervene.  Critical Care Time devoted to patient care services described in this note is 45 minutes and is exclusive of time spent in procedures supervisory time of NP.

## 2017-07-18 NOTE — Progress Notes (Signed)
eLink Physician-Brief Progress Note Patient Name: Tanya Wells DOB: October 20, 1946 MRN: [id number]   Date of Service  Jul 30, 2017  HPI/Events of Note  BP low On MAJOR maxed pressors x 3 No increase in pressors would change outcome dopamine is way out of effective dose as well   eICU Interventions  Camera care done, appears comfortable on vent Will send over pccm NP to d/w family consider comfort care in this poor pt with met caner MODS, severe anoxia and horrible prognosis     Intervention Category Major Interventions: Hypotension - evaluation and management  FEINSTEIN,DANIEL J. 07/30/17, 8:01 PM

## 2017-07-18 NOTE — Progress Notes (Signed)
Family at bedside.  Arvil Chaco, NP spoke with family.  Family has decided to make patient comfort care.  Chaplin notified.  Emotional support given.

## 2017-07-18 NOTE — Progress Notes (Signed)
[facility] [place] NAME: Tanya Wells    MR#:  [id number]  DATE OF BIRTH:  22-Sep-1946  SUBJECTIVE:  Called by nurse pt c/o sob then started have respiratory difficulty and noted to have PEA, and required intubation, Pt had to have CPR Requiring epinephrine multiple doses , dose of bicarbonate. Patient required intubation.        REVIEW OF SYSTEMS:   Review of Systems  Unable to perform ROS: Intubated  Constitutional: Positive for fever.   Tolerating Diet:yes Tolerating PT: pending  DRUG ALLERGIES:   Allergies  Allergen Reactions  . Codeine Nausea Only    VITALS:  Blood pressure 99/85, pulse 62, temperature 97.8 F (36.6 C), temperature source Oral, resp. rate (!) 26, height 4\' 11"  (1.499 m), weight 132 lb (59.9 kg), SpO2 (!) 80 %.  PHYSICAL EXAMINATION:   Physical Exam  GENERAL:  71 y.o.-year-old patient lying in the bed Chronically ill-appearing  EYES: Pupils equal, round, reactive to light and accommodation. No scleral icterus. Extraocular muscles intact.  HEENT: Head atraumatic, normocephalic. Oropharynx and nasopharynx clear.  NECK:  Supple, no jugular venous distention. No thyroid enlargement, no tenderness.  LUNGS:Rhonchus breath sounds within both lungs, dressing from pacemaker present  CARDIOVASCULAR: S1, S2 normal. No murmurs, rubs, or gallops.  ABDOMEN: Soft, nontender, nondistended. Bowel sounds present. No organomegaly or mass.  EXTREMITIES: Right upper extremity swelling less from her hand down to her shoulder    NEUROLOGIC: intubated  PSYCHIATRIC:  intubated SKIN: No obvious rash, lesion, or ulcer.   LABORATORY PANEL:  CBC  Recent Labs Lab Jul 29, 2017 0433  WBC 9.0  HGB 11.7*  HCT 35.0  PLT 126*    Chemistries   Recent Labs Lab 07/09/17 0518 06/23/2017 1700  29-Jul-2017 0433  NA 140 156*  < > 138  K 4.4 3.1*  < > 3.9  CL 112* 97*  < > 102  CO2 22 45*  < > 29  GLUCOSE 117* 209*  < >  132*  BUN 8 9  < > 13  CREATININE 0.77 0.90  < > 0.57  CALCIUM 8.0* 6.5*  < > 8.6*  MG 1.9  --   --   --   AST  --  145*  --   --   ALT  --  113*  --   --   ALKPHOS  --  141*  --   --   BILITOT  --  1.1  --   --   < > = values in this interval not displayed. Cardiac Enzymes  Recent Labs Lab 07/11/17 0806  TROPONINI 0.68*   RADIOLOGY:  US Venous Img Lower Unilateral Right  Result Date: 07/01/2017 CLINICAL DATA:  Acute right lower extremity swelling. EXAM: Right LOWER EXTREMITY VENOUS DOPPLER ULTRASOUND TECHNIQUE: Gray-scale sonography with graded compression, as well as color Doppler and duplex ultrasound were performed to evaluate the lower extremity deep venous systems from the level of the common femoral vein and including the common femoral, femoral, profunda femoral, popliteal and calf veins including the posterior tibial, peroneal and gastrocnemius veins when visible. The superficial great saphenous vein was also interrogated. Spectral Doppler was utilized to evaluate flow at rest and with distal augmentation maneuvers in the common femoral, femoral and popliteal veins. COMPARISON:  Ultrasound of July 05, 2017. FINDINGS: Contralateral Common Femoral Vein: Respiratory phasicity is normal and symmetric with the symptomatic side. No evidence of thrombus. Normal compressibility. Common Femoral Vein: No evidence  of thrombus. Normal compressibility, respiratory phasicity and response to augmentation. Saphenofemoral Junction: There appears to be nonocclusive thrombus in the right greater saphenous vein near its junction with the femoral vein, but does not extend into the right common femoral vein. Profunda Femoral Vein: No evidence of thrombus. Normal compressibility and flow on color Doppler imaging. Femoral Vein: No evidence of thrombus. Normal compressibility, respiratory phasicity and response to augmentation. Popliteal Vein: No evidence of thrombus. Normal compressibility, respiratory  phasicity and response to augmentation. Calf Veins: No evidence of thrombus. Normal compressibility and flow on color Doppler imaging. Superficial Great Saphenous Vein: As described above, nonocclusive thrombus is seen near the saphenofemoral junction. Venous Reflux:  None. Other Findings:  None. IMPRESSION: Nonocclusive thrombus seen in the right greater saphenous vein near the saphenofemoral junction, but not seen to extend into the right common femoral vein. Given the proximity of the thrombus to the saphenofemoral junction, this is considered deep venous thrombosis. No other thrombus is noted. These results will be called to the ordering clinician or representative by the Radiologist Assistant, and communication documented in the PACS or zVision Dashboard. Electronically Signed   By: Marijo Conception, M.D.   On: 06/27/2017 13:42   US Venous Img Upper Uni Right  Result Date: 07/16/2017 CLINICAL DATA:  RIGHT upper extremity swelling EXAM: RIGHT UPPER EXTREMITY VENOUS DOPPLER ULTRASOUND TECHNIQUE: Gray-scale sonography with graded compression, as well as color Doppler and duplex ultrasound were performed to evaluate the upper extremity deep venous system from the level of the subclavian vein and including the jugular, axillary, basilic, radial, ulnar and upper cephalic vein. Spectral Doppler was utilized to evaluate flow at rest and with distal augmentation maneuvers. COMPARISON:  None FINDINGS: Contralateral Subclavian Vein: Respiratory phasicity is normal and symmetric with the symptomatic side. No evidence of thrombus. Normal compressibility. Internal Jugular Vein: No evidence of thrombus. Normal compressibility, respiratory phasicity and response to augmentation. Subclavian Vein: No evidence of thrombus. Normal compressibility, respiratory phasicity and response to augmentation. Axillary Vein: No evidence of thrombus. Normal compressibility, respiratory phasicity and response to augmentation. Cephalic Vein:  No evidence of thrombus in the visualized proximal portion. Distal portion is not visualized. Normal compressibility, respiratory phasicity and response to augmentation. Basilic Vein: Not seen Brachial Veins: No evidence of thrombus. Normal compressibility, respiratory phasicity and response to augmentation. Radial Veins: No evidence of thrombus. Normal compressibility, respiratory phasicity and response to augmentation. Ulnar Veins: No evidence of thrombus. Normal compressibility, respiratory phasicity and response to augmentation. Venous Reflux:  None visualized. Other Findings:  Subcutaneous edema at wrist. IMPRESSION: No evidence of DVT within the RIGHT upper extremity. RIGHT basilic vein was not visualized. Electronically Signed   By: Lavonia Dana M.D.   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  1. Cardiorespiratory arrest Recurrent Patient currently started on a Levophed drip. She remains intubated. I have discussed the case with the intensivist who will be seeing the patient I have also asked the cardiologist to reevaluate the patient. Due to her recent small DVT go ahead and start her on IV heparin drip Continue IV antibiotics for pneumonia I have also updated her husband on the phone he will be coming to see her    55 minutes of critical care time spent on this patient I was there while the code was occurring  Note: This  dictation was prepared with Dragon dictation along with smaller phrase technology. Any transcriptional errors that result from this process are unintentional.  Dustin Flock M.D on Jul 16, 2017 at 12:54 PM  Between 7am to 6pm - Pager - [phone]  After 6pm go to [email or web address] - Proofreader  [facility]  Office  [phone]  CC: Primary care physician;  Gennette Pac, NP

## 2017-07-18 NOTE — Progress Notes (Signed)
Chaplain was paged due to patient actively dying. CH provided prayer, as requested, ministry of presence and words of comfort as [place] and family sat as patient died.     August 02, 2017 2240  Clinical Encounter Type  Visited With Patient;Patient and family together;Health care provider  Visit Type Follow-up;Spiritual support;Patient actively dying  Referral From Nurse  Consult/Referral To Chaplain  Spiritual Encounters  Spiritual Needs Prayer;Grief support;Other (Comment)

## 2017-07-18 NOTE — Progress Notes (Signed)
Called to evaluate patient for persistent hypotension despite being on maximum doses of three pressors. 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 She remains in severe cardiogenic shock despite resuscitation. Dr. Ashby Dawes had spoken with the family regarding patient's prognosis. Given her declining V/S despite aggressive resuscitation, I contacted her husband and asked him to return to the hospital. Once all family members arrived, the husband in consultation with patient's family decided  that she should be made full comfort care. The chaplain was called to the bedside for prayers and end of life care orders were placed. Patient was terminally extubated at ~2300 and expired at 23:35.   Magdalene S. [facility] ANP-BC Pulmonary and Critical Care Medicine [facility] Pager [phone] or [phone]

## 2017-07-18 NOTE — Progress Notes (Signed)
Speech Language Pathology Dysphagia Treatment Patient Details Name: Tanya Wells MRN: [id number] DOB: 01/16/1946 Today's Date: 31-Jul-2017 Time: 4268-3419 SLP Time Calculation (min) (ACUTE ONLY): 17 min  Assessment / Plan / Recommendation Clinical Impression   pt continues to present with  A mild oral pharyngeal dysphagia characterized by prolonged mastication of solids puree and soft.  Pt had no coughing, throat clear or SOB noted with intake of trials. Pt intake slow. SLP educated on need for increased intake but educated slow rate of intake and alternating solids and liquids decreases risk of aspiration. Pt able to verbally retell aspiration precautions. SLP will continue to monitor.     Diet Recommendation    Soft with thin   SLP Plan   Continue with current plan F/U in 1-2 days.  Pertinent Vitals/Pain None reported    Swallowing Goals     General Behavior/Cognition: Alert;Cooperative;Pleasant mood Patient Positioning: Upright in bed Oral care provided: N/A  Oral Cavity - Oral Hygiene     Dysphagia Treatment Treatment Methods: Skilled observation;Upgraded PO texture trial;Patient/caregiver education Patient observed directly with PO's: Yes Type of PO's observed: Dysphagia 3 (soft);Thin liquids Feeding: Able to feed self Liquids provided via: Teaspoon;Cup;Straw Oral Phase Signs & Symptoms: Prolonged mastication Pharyngeal Phase Signs & Symptoms: Audible swallow Amount of cueing: Independent   GO     Tonita Phoenix July 31, 2017, 11:31 AM

## 2017-07-18 NOTE — Progress Notes (Signed)
Patient extubated per CPT.

## 2017-07-18 NOTE — Progress Notes (Signed)
Chaplain received and responded to a RRT that changed to a Code Blue. Guided patient's husband to waiting room and sat with him, providing ministry of presence and offering silent prayer. When informed of patient now being in ICU, lead husband to ICU and informed nurse of him waiting.   As CH was walking in hallway, sister, whom Goff met yesterday shared patient is "dying" and asked [place] to go in and prayer for her. [place] agreed, visited patient and offered prayer as requested. CH offered to walk with husband and sister to see patient and did so, offering words of comfort, grief support, and ministry of presence.

## 2017-07-18 NOTE — Procedures (Signed)
Central Venous Catheter Placement: Indication: Patient receiving vesicant or irritant drug.; Patient receiving intravenous therapy for longer than 5 days.; Patient has limited or no vascular access.   Consent:emergent.   Risks and benefits explained in detail including risk of infection, bleeding, respiratory failure and death..   Hand washing performed prior to starting the procedure.   Procedure: An active timeout was performed and correct patient, name, & ID confirmed.  Patient was prepped using strict sterile technique including chlorohexadine preps, sterile drape, sterile gown and sterile gloves.  The area was prepped, draped and anesthetized in the usual sterile manner.   A triple lumen catheter was placed in the left femoral Vein after unable to cannulate the right femoral vein. There was good blood return, catheter caps were placed on lumens, catheter flushed easily, the line was secured and a sterile dressing and BIO-PATCH applied.   Ultrasound was used to visualize vasculature and guidance of needle.   Number of Attempts: 2 Complications:none Estimated Blood Loss: 15cc Operator: Marda Stalker, M.D.   Marda Stalker, M.D.  Pulmonary & Critical Care Medicine Intensive Care Unit

## 2017-07-18 NOTE — Progress Notes (Signed)
Code blue note  Responded to code. I was seeing the patient in the next room.  On arrival patient unresponsive. CPR started. Patient had CPR for 2 minutes. Received 1 dose of epinephrine. PEA. After this patient was awake. Thready pulse. Blood pressure 77/42. Normal saline 1 L bolus started. Levophed ordered. Patient was intubated by ER physician Dr. Alfred Levins. Lungs auscultated and air entry on both sides.  Patient transferred to CCU. Handed over care to Dr. Posey Pronto.

## 2017-07-18 NOTE — Progress Notes (Signed)
Patient without respiration or heart beat.  Family at bedside. Chaplin at bedside.   Time of death 23:35.  NP M. Tukov informed.  Nursing supervisor and [employer] called.  Emotional support given.

## 2017-07-18 NOTE — Progress Notes (Signed)
Called for patient with PEA arrest.  Per report, this am, she had shortness of breath and was placed on NRB then became unresponsive, and lost her pulse, requiring CPR.  Review of telemetry reveals sinus rhythm.  I do not see AV block or any ventricular arrhythmias.   Reports are that BP was "normal" prior to her respiratory event.  She has required levophed since her cardiac arrest today. Recent hospital course is not completely clear in epic.  There is suggestion of pacemaker wire dislodgement and plans for lead revision, though I cannot tell that that was performed (do not see second operative note).  There appears to be normal pacemaker function by telemetry since that time.  --> will have device interrogated once she is more stable. Echo post PPM states "features were not consistent with tamponade physiology" though there is no mention of whether or not effusion is seen.  EF was substantially depressed. CXR post implant appears to show stable lead, without obvious dislodgement.  There is diffuse edema but no ptx.  There are no follow-up CXRs.  Pt currently receiving central line placement by medical team.  I will return when patient can be examined.  Very difficult situation.  Prognosis is guarded at best.  Thompson Grayer MD, [facility] 08-07-17

## 2017-07-18 NOTE — Progress Notes (Signed)
Levophed drip infusing almost maxed and blood pressure still low therefore Dr. Ashby Dawes ordered vasopressin drip.  MD at bedside to attempt central line placement.

## 2017-07-18 NOTE — Progress Notes (Signed)
E Link notified of continued hypotension.

## 2017-07-18 NOTE — ED Provider Notes (Signed)
[facility] Department of Emergency Medicine   Code Blue CONSULT NOTE  Chief Complaint: Cardiac arrest/unresponsive   Level V Caveat: Unresponsive  History of present illness: I was contacted by the hospital for a CODE BLUE cardiac arrest upstairs and presented to the patient's bedside.   Brief history on the patient is that she had been admitted to the hospital for syncopal episode and had a pacemaker/defibrillator placed 4 days ago followed by a CODE BLUE cardiac arrest. This morning patient was complaining of shortness of breath and she was found unresponsive and pulseless. Upon my arrival patient had no pulse and CPR was in progress.  ROS: Unable to obtain, Level V caveat  Scheduled Meds: . atorvastatin  20 mg Per Tube q1800  . chlorhexidine gluconate (MEDLINE KIT)  15 mL Mouth Rinse BID  . dorzolamide-timolol  1 drop Both Eyes BID  . furosemide  40 mg Intravenous Q8H  . guaiFENesin  600 mg Oral BID  . ipratropium-albuterol  3 mL Nebulization TID  . latanoprost  1 drop Both Eyes QHS  . levothyroxine  125 mcg Oral QAC breakfast  . potassium chloride  40 mEq Oral BID   Continuous Infusions: . azithromycin Stopped (07-30-17 1202)  . cefTRIAXone (ROCEPHIN)  IV Stopped (07/30/2017 0955)  . norepinephrine (LEVOPHED) Adult infusion 20 mcg/min (07/30/2017 1157)   PRN Meds:.acetaminophen **OR** acetaminophen, bisacodyl, docusate, fentaNYL (SUBLIMAZE) injection, fentaNYL (SUBLIMAZE) injection, guaiFENesin-dextromethorphan, [DISCONTINUED] ondansetron **OR** ondansetron (ZOFRAN) IV, sodium chloride flush Past Medical History:  Diagnosis Date  . Anemia   . Depression   . GERD (gastroesophageal reflux disease)   . Hyperlipidemia   . Hypothyroidism   . Liver cyst   . 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 Pulmonary hypertension ([place])    a. TTE 07/06/17: EF 60-65%, nl WM, nl diastolic fxn, mild MR, PASP 38  . Syncope    Past Surgical History:  Procedure  Laterality Date  . ABDOMINAL HYSTERECTOMY    . PACEMAKER INSERTION N/A 07/09/2017   Procedure: INSERTION PACEMAKER-DOUBLE CHAMBER;  Surgeon: Isaias Cowman, MD;  Location: [facility] ORS;  Service: Cardiovascular;  Laterality: N/A;   Social History   Social History  . Marital status: Married    Spouse name: N/A  . Number of children: N/A  . Years of education: N/A   Occupational History  . Not on file.   Social History Main Topics  . Smoking status: Never Smoker  . Smokeless tobacco: Never Used  . Alcohol use No  . Drug use: No  . Sexual activity: No   Other Topics Concern  . Not on file   Social History Narrative  . No narrative on file   Allergies  Allergen Reactions  . Codeine Nausea Only    Last set of Vital Signs (not current) Vitals:   2017-07-30 1204 07-30-2017 1206  BP: (!) 108/56 (!) 47/22  Pulse:    Resp: (!) 23 (!) 32  Temp:        Physical Exam  Gen: unresponsive Cardiovascular: pulseless  Resp: apneic. Breath sounds equal bilaterally with bagging  Abd: nondistended  Neuro: GCS 3, unresponsive to pain  HEENT: No blood in posterior pharynx, gag reflex absent  Neck: No crepitus  Musculoskeletal: No deformity  Skin: warm  Procedures  INTUBATION Performed by: Rudene Re Required items: required blood products, implants, devices, and special equipment available Patient identity confirmed: provided demographic data and hospital-assigned identification number Time out: Immediately prior to procedure a "time out" was called  to verify the correct patient, procedure, equipment, support staff and site/side marked as required. Indications: cardiac arrest Intubation method: glydescope Preoxygenation: BVM Sedatives: etomidate Paralytic: Succinylcholine  Tube Size: 7.5 cuffed Post-procedure assessment: chest rise and ETCO2 monitor Breath sounds: equal and absent over the epigastrium Tube secured by Respiratory Therapy Patient tolerated the  procedure well with no immediate complications.  INTUBATION Performed by: Rudene Re Required items: required blood products, implants, devices, and special equipment available Patient identity confirmed: provided demographic data and hospital-assigned identification number Time out: Immediately prior to procedure a "time out" was called to verify the correct patient, procedure, equipment, support staff and site/side marked as required. Indications: cardiac arrest Intubation method: glydescope Preoxygenation: BVM Sedatives: none Paralytic: none Tube Size: 7.5 cuffed Post-procedure assessment: chest rise and ETCO2 monitor Breath sounds: equal and absent over the epigastrium Tube secured by Respiratory Therapy Patient tolerated the procedure well with no immediate complications.  CRITICAL CARE Performed by: Rudene Re Total critical care time: 30 Critical care time was exclusive of separately billable procedures and treating other patients. Critical care was necessary to treat or prevent imminent or life-threatening deterioration. Critical care was time spent personally by me on the following activities: development of treatment plan with patient and/or surrogate as well as nursing, discussions with consultants, evaluation of patient's response to treatment, examination of patient, obtaining history from patient or surrogate, ordering and performing treatments and interventions, ordering and review of laboratory studies, ordering and review of radiographic studies, pulse oximetry and re-evaluation of patient's condition.  Cardiopulmonary Resuscitation (CPR) Procedure Note  Directed/Performed by: Rudene Re I personally directed ancillary staff and/or performed CPR in an effort to regain return of spontaneous circulation and to maintain cardiac, neuro and systemic perfusion.    Medical Decision making  Patient is postop day 4 from defibrillator/pacemaker implantation  which was complicated by a CODE BLUE who had a second cardiac arrest on the floor. CODE STATUS was verified as full. On arrival to the room CPR was in progress. Patient had bilateral breath sounds with BVM and no pulse. Initial rhythm was PEA arrest. Patient was intubated once ROSC was achieved with etomidate and succinylcholine. Upon transfer from floor to ICU bed ETT and peripheral IV dislodged and patient was noted to be back in cardiac arrest. Initial rhythm again PEA arrest. Patient was reintubated for noted above. Multiple rounds of epinephrine, magnesium due to prolonged QTC, and bicarbonate were given with ROSC achieved again. Two IOs were placed for medication administration. Levophed was initiated for hypotension and patient was signed out to Dr. Posey Pronto with BP 110/68 and HR 125.   Assessment and [place], [place], MD August 13, 2017 1215

## 2017-07-18 NOTE — Death Summary Note (Signed)
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, cardiogenic shock and anoxic encephalopathy  Diagnoses  Preliminary cause of death:  Secondary Diagnoses (including complications and co-morbidities):  Principal Problem:   Syncope Active Problems:   Carcinoid tumor   Elevated troponin   Orthostatic hypotension   Sinus pause   Tachycardia-bradycardia syndrome ([place])   Encounter for central line placement   Cardiac arrest ([place])   Respiratory failure ([place])   Acute systolic heart failure ([place]) 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 She remained in severe cardiogenic shock with multiorgan failure despite resuscitation. She was made full comfort care on 08/09/23 and expired at 04/10/2334    Pertinent Labs and Studies  Significant Diagnostic Studies Dg Abd 1 View  Result Date: Aug 08, 2017 CLINICAL DATA:  Orogastric tube placement. EXAM: ABDOMEN - 1 VIEW COMPARISON:  None. FINDINGS: Nonobstructive bowel gas pattern. Moderate amount of stool in the right colon. Enteric catheter overlies gastric body with tip at the expected location of the antrum.  IMPRESSION: Enteric catheter overlies gastric body with tip at the expected location of the antrum. Electronically Signed   By: Fidela Salisbury M.D.   On: 2017/08/08 18:24   Ct Head Wo Contrast  Result Date: 07/02/2017 CLINICAL DATA:  71 year old female post code blue. Down for 15 minutes. Initial encounter. EXAM: CT HEAD WITHOUT CONTRAST TECHNIQUE: Contiguous axial images were obtained from the base of the skull through the vertex without intravenous contrast. COMPARISON:  07/05/2017. FINDINGS: Brain: No intracranial hemorrhage or CT evidence of large acute infarct. Currently no definitive evidence of global hypoxia. This may present in a delayed fashion and possibility can be evaluated on follow-up depending on the patient's clinical status. Mild global atrophy. No intracranial mass lesion noted on this unenhanced exam. Vascular: Vascular calcification Skull: No acute abnormality Sinuses/Orbits: No acute orbital abnormality. Visualized lungs clear. Other: Mastoid air cells and middle ear cavities clear. IMPRESSION: Currently no CT evidence to suggest global hypoxia. If the patient has clinical findings to suggest such, this could be further evaluated on follow-up CT as CT evidence of hypoxia may present in a delayed fashion. No intracranial hemorrhage or CT evidence of large acute infarct. Electronically Signed   By: Genia Del M.D.   On: 07/15/2017 19:10   Ct Angio Chest Pe W Or Wo Contrast  Result Date: 06/19/2017 CLINICAL DATA:  Post code after pacemaker EXAM: CT ANGIOGRAPHY CHEST WITH CONTRAST TECHNIQUE: Multidetector CT imaging of the chest was performed using the standard protocol during bolus administration of intravenous contrast. Multiplanar CT image reconstructions and MIPs were obtained to evaluate the vascular anatomy. CONTRAST:  75 mL Isovue 370 intravenous COMPARISON:  Radiograph 07/13/2017, CT chest 07/05/2017, PET-CT 04/03/2017  FINDINGS: Cardiovascular: Satisfactory opacification of  the pulmonary arteries to the segmental level. No evidence of pulmonary embolism. Non aneurysmal aorta. Atherosclerosis is present. Coronary artery calcification. Small amount of iatrogenic in the pulmonary trunk. Left-sided cardiac pacing device with leads in the right atrium and right ventricle. Borderline cardiomegaly. Small pericardial effusion. Mediastinum/Nodes: No thyroid mass. Endotracheal tube tip terminates about a cm above the carina. Nonspecific mediastinal lymph nodes, slightly increased compared to prior study. Esophagus unremarkable Lungs/Pleura: Small to moderate bilateral pleural effusions. Dense consolidation within the bilateral lower lobes. Ground-glass density within the right upper lobe with patchy ground-glass density in the right middle lobe. 4 mm left upper lobe pulmonary nodule, series 7, image number 23. Oval nodular density subpleural left upper lobe series 7, image number 32. No pneumothorax. Upper Abdomen: Known hepatic metastatic disease is not well visualized on today's study due to timing of contrast bolus. Musculoskeletal: No acute or suspicious bone lesions are seen. Review of the MIP images confirms the above findings. IMPRESSION: 1. Negative for acute pulmonary embolus 2. Small to moderate bilateral pleural effusions with dense bilateral lower lobe consolidations which may reflect atelectasis, pneumonia or aspiration. Ground-glass density in the right upper and middle lobes may reflect asymmetric edema or possibly infection. 3. 4 mm left upper lobe pulmonary nodule, probably infectious or inflammatory given time course of development. 4. Known hepatic metastatic disease not well appreciated on the current study. Aortic Atherosclerosis (ICD10-I70.0). Electronically Signed   By: Donavan Foil M.D.   On: 07/06/2017 19:23   Ct Angio Chest Pe W Or Wo Contrast  Result Date: 07/05/2017 CLINICAL DATA:  Dyspnea and tachycardia. Elevated white blood cell count. History of gastric  carcinoid tumor. EXAM: CT ANGIOGRAPHY CHEST WITH CONTRAST TECHNIQUE: Multidetector CT imaging of the chest was performed using the standard protocol during bolus administration of intravenous contrast. Multiplanar CT image reconstructions and MIPs were obtained to evaluate the vascular anatomy. CONTRAST:  70 mL Isovue 370 IV COMPARISON:  1. Chest radiograph 07/05/2017 2. PET CT 04/03/2017 3. CT chest 03/07/2017 FINDINGS: Cardiovascular: Contrast injection is sufficient to demonstrate satisfactory opacification of the pulmonary arteries to the segmental level. There is no pulmonary embolus. The main pulmonary artery is within normal limits for size. There is no CT evidence of acute right heart strain. The visualized aorta is normal. There is a normal 3-vessel arch branching pattern. Heart size is normal, without pericardial effusion. There is atherosclerotic calcification in the coronary arteries. Mediastinum/Nodes: No mediastinal, hilar or axillary lymphadenopathy. The visualized thyroid and thoracic esophageal course are unremarkable. Lungs/Pleura: No pulmonary nodules or masses. No pleural effusion or pneumothorax. No focal airspace consolidation. No focal pleural abnormality. Upper Abdomen: Contrast bolus timing is not optimized for evaluation of the abdominal organs. Known hepatic metastases are not well characterized on this study. Musculoskeletal: No chest wall abnormality. No acute or significant osseous findings. Review of the MIP images confirms the above findings. IMPRESSION: 1. No pulmonary embolus. 2. No other acute thoracic abnormality. 3. Hepatic metastases demonstrated on prior PET CT are not visible on this study. Electronically Signed   By: Ulyses Jarred M.D.   On: 07/05/2017 22:26   US Carotid Bilateral  Result Date: 07/09/2017 CLINICAL DATA:  71 year old female with syncope EXAM: BILATERAL CAROTID DUPLEX ULTRASOUND TECHNIQUE: Pearline Cables scale imaging, color Doppler and duplex ultrasound were  performed of bilateral carotid and vertebral arteries in the neck. COMPARISON:  None. FINDINGS: Criteria: Quantification of carotid stenosis is based on velocity parameters that correlate the residual  internal carotid diameter with NASCET-based stenosis levels, using the diameter of the distal internal carotid lumen as the denominator for stenosis measurement. The following velocity measurements were obtained: RIGHT ICA:  55/14 cm/sec CCA:  56/38 cm/sec SYSTOLIC ICA/CCA RATIO:  0.9 DIASTOLIC ICA/CCA RATIO:  7.56 ECA:  86 cm/sec LEFT ICA:  58/15 cm/sec CCA:  43/32 cm/sec SYSTOLIC ICA/CCA RATIO:  0.9 DIASTOLIC ICA/CCA RATIO:  1.2 ECA:  55 cm/sec RIGHT CAROTID ARTERY: Mild focal heterogeneous atherosclerotic plaque in the proximal internal carotid artery. By peak systolic velocity criteria, the estimated stenosis remains less than 50%. RIGHT VERTEBRAL ARTERY:  Patent with normal antegrade flow. LEFT CAROTID ARTERY: No significant atherosclerotic plaque or evidence of stenosis. LEFT VERTEBRAL ARTERY:  Patent with normal antegrade flow. IMPRESSION: 1. Mild (1-49%) stenosis proximal right internal carotid artery secondary to focal heterogeneous atherosclerotic plaque. 2. No significant atherosclerotic plaque or evidence of stenosis in the left internal carotid artery. 3. Vertebral arteries are patent with antegrade flow. Signed, Criselda Peaches, MD [facility] Electronically Signed   By: Jacqulynn Cadet M.D.   On: 07/09/2017 18:23   US Venous Img Lower Unilateral Right  Result Date: 07/03/2017 CLINICAL DATA:  Acute right lower extremity swelling. EXAM: Right LOWER EXTREMITY VENOUS DOPPLER ULTRASOUND TECHNIQUE: Gray-scale sonography with graded compression, as well as color Doppler and duplex ultrasound were performed to evaluate the lower extremity deep venous systems from the level of the common femoral vein and including the common femoral, femoral,  profunda femoral, popliteal and calf veins including the posterior tibial, peroneal and gastrocnemius veins when visible. The superficial great saphenous vein was also interrogated. Spectral Doppler was utilized to evaluate flow at rest and with distal augmentation maneuvers in the common femoral, femoral and popliteal veins. COMPARISON:  Ultrasound of July 05, 2017. FINDINGS: Contralateral Common Femoral Vein: Respiratory phasicity is normal and symmetric with the symptomatic side. No evidence of thrombus. Normal compressibility. Common Femoral Vein: No evidence of thrombus. Normal compressibility, respiratory phasicity and response to augmentation. Saphenofemoral Junction: There appears to be nonocclusive thrombus in the right greater saphenous vein near its junction with the femoral vein, but does not extend into the right common femoral vein. Profunda Femoral Vein: No evidence of thrombus. Normal compressibility and flow on color Doppler imaging. Femoral Vein: No evidence of thrombus. Normal compressibility, respiratory phasicity and response to augmentation. Popliteal Vein: No evidence of thrombus. Normal compressibility, respiratory phasicity and response to augmentation. Calf Veins: No evidence of thrombus. Normal compressibility and flow on color Doppler imaging. Superficial Great Saphenous Vein: As described above, nonocclusive thrombus is seen near the saphenofemoral junction. Venous Reflux:  None. Other Findings:  None. IMPRESSION: Nonocclusive thrombus seen in the right greater saphenous vein near the saphenofemoral junction, but not seen to extend into the right common femoral vein. Given the proximity of the thrombus to the saphenofemoral junction, this is considered deep venous thrombosis. No other thrombus is noted. These results will be called to the ordering clinician or representative by the Radiologist Assistant, and communication documented in the PACS or zVision Dashboard. Electronically Signed    By: Marijo Conception, M.D.   On: 06/23/2017 13:42   US Venous Img Upper Uni Right  Result Date: 06/20/2017 CLINICAL DATA:  RIGHT upper extremity swelling EXAM: RIGHT UPPER EXTREMITY VENOUS DOPPLER ULTRASOUND TECHNIQUE: Gray-scale sonography with graded compression, as well as color Doppler and duplex ultrasound were performed to evaluate the upper extremity deep venous system from the level of  the subclavian vein and including the jugular, axillary, basilic, radial, ulnar and upper cephalic vein. Spectral Doppler was utilized to evaluate flow at rest and with distal augmentation maneuvers. COMPARISON:  None FINDINGS: Contralateral Subclavian Vein: Respiratory phasicity is normal and symmetric with the symptomatic side. No evidence of thrombus. Normal compressibility. Internal Jugular Vein: No evidence of thrombus. Normal compressibility, respiratory phasicity and response to augmentation. Subclavian Vein: No evidence of thrombus. Normal compressibility, respiratory phasicity and response to augmentation. Axillary Vein: No evidence of thrombus. Normal compressibility, respiratory phasicity and response to augmentation. Cephalic Vein: No evidence of thrombus in the visualized proximal portion. Distal portion is not visualized. Normal compressibility, respiratory phasicity and response to augmentation. Basilic Vein: Not seen Brachial Veins: No evidence of thrombus. Normal compressibility, respiratory phasicity and response to augmentation. Radial Veins: No evidence of thrombus. Normal compressibility, respiratory phasicity and response to augmentation. Ulnar Veins: No evidence of thrombus. Normal compressibility, respiratory phasicity and response to augmentation. Venous Reflux:  None visualized. Other Findings:  Subcutaneous edema at wrist. IMPRESSION: No evidence of DVT within the RIGHT upper extremity. RIGHT basilic vein was not visualized. Electronically Signed   By: Lavonia Dana M.D.   On: 06/25/2017 16:12    Dg Chest Port 1 View  Result Date: July 15, 2017 CLINICAL DATA:  Respiratory failure. EXAM: PORTABLE CHEST 1 VIEW COMPARISON:  07/11/2017 FINDINGS: Left chest wall pacer device is noted with lead in the right atrial appendage and right ventricle. Normal heart size. Improvement in bilateral pleural effusions. The endotracheal tube tip is above the carina. Diffuse interstitial edema pattern is slightly improved from previous exam. There is a progressive airspace opacity noted in the left lower lobe. IMPRESSION: 1. Improvement in pulmonary edema and pleural effusions. 2. Progressive airspace opacities noted in the left base which may represent pneumonia. Electronically Signed   By: Kerby Moors M.D.   On: 15-Jul-2017 15:18   Dg Chest Port 1 View  Result Date: 07/11/2017 CLINICAL DATA:  Respiratory failure EXAM: PORTABLE CHEST 1 VIEW COMPARISON:  07/11/2017 FINDINGS: Endotracheal tube remains satisfactorily positioned, 2.6 cm above the carina. Grossly intact transvenous cardiac leads. Probable pleural effusions. Patchy central airspace opacity in the right lung, appearing partially cleared compared to 07/05/2017. IMPRESSION: 1.  Support equipment appears satisfactorily positioned. 2. Partial clearance of central right lung airspace opacity. Persistent pleural effusions. Electronically Signed   By: Andreas Newport M.D.   On: 07/11/2017 05:29   Dg Chest Port 1 View  Result Date: 06/26/2017 CLINICAL DATA:  line placement EXAM: PORTABLE CHEST 1 VIEW COMPARISON:  06/22/2017 FINDINGS: Endotracheal tube tip is approximately 2.6 cm superior to the carina. Left-sided duo lead pacing device as before. Interim development of diffuse ground-glass density throughout the right thorax. Linear scarring on the left. Small right effusion. Normal heart size. No pneumothorax. No definite additional ascending or descending catheters are seen. IMPRESSION: 1. Endotracheal tube tip about 2.6 cm superior to carina 2. Tiny right  pleural effusion. Development of diffuse ground-glass density in the right thorax, findings could be secondary to asymmetric edema, diffuse infection, or hemorrhage. Electronically Signed   By: Donavan Foil M.D.   On: 06/24/2017 17:38   Dg Chest Port 1 View  Result Date: 07/09/2017 CLINICAL DATA:  Post pacemaker insertion, history of carcinoid tumor of the stomach EXAM: PORTABLE CHEST 1 VIEW COMPARISON:  CT chest of 07/05/2017 and chest x-ray of the same day FINDINGS: The heart is borderline enlarged and there is some fullness of the perihilar vasculature suggesting mild  fluid overload. Tiny pleural effusions cannot be excluded. Permanent pacemaker is now present. No bony abnormality is seen. IMPRESSION: 1. Question mild pulmonary vascular congestion. 2. Cannot exclude small pleural effusions. 3. Permanent pacemaker is present. Electronically Signed   By: Ivar Drape M.D.   On: 06/19/2017 13:53   Dg C-arm 1-60 Min-no Report  Result Date: 07/15/2017 Fluoroscopy was utilized by the requesting physician.  No radiographic interpretation.    Microbiology Recent Results (from the past 240 hour(s))  MRSA PCR Screening     Status: None   Collection Time: 07-16-17 12:11 PM  Result Value Ref Range Status   MRSA by PCR NEGATIVE NEGATIVE Final    Comment:        The GeneXpert MRSA Assay (FDA approved for NASAL specimens only), is one component of a comprehensive MRSA colonization surveillance program. It is not intended to diagnose MRSA infection nor to guide or monitor treatment for MRSA infections.     Lab Basic Metabolic Panel:  Recent Labs Lab 07/09/17 0518 07/16/2017 1700 07/08/2017 0411 07/13/17 0401 07/16/2017 0433  NA 140 156* 136 139 138  K 4.4 3.1* 3.5 4.0 3.9  CL 112* 97* 101 100* 102  CO2 22 45* 28 31 29   GLUCOSE 117* 209* 125* 141* 132*  BUN 8 9 12 12 13   CREATININE 0.77 0.90 0.64 0.69 0.57  CALCIUM 8.0* 6.5* 7.7* 8.1* 8.6*  MG 1.9  --   --   --   --    Liver Function  Tests:  Recent Labs Lab 06/30/2017 1036 07/17/2017 1700  AST 96* 145*  ALT 82* 113*  ALKPHOS 205* 141*  BILITOT 1.0 1.1  PROT 5.2* 3.5*  ALBUMIN 3.1* 2.0*   No results for input(s): LIPASE, AMYLASE in the last 168 hours. No results for input(s): AMMONIA in the last 168 hours. CBC:  Recent Labs Lab 07/09/17 0518 07/07/2017 1637 06/19/2017 0411 07/13/17 0401 2017-07-16 0433  WBC 6.2 21.2* 13.7* 10.4 9.0  HGB 11.0* 12.7 10.2* 10.1* 11.7*  HCT 33.1* 39.2 30.6* 30.3* 35.0  MCV 87.1 90.3 87.8 85.8 88.7  PLT 123* 127* 119* 119* 126*   Cardiac Enzymes:  Recent Labs Lab 07/17/2017 1700 06/22/2017 2229 07/11/17 0806 July 16, 2017 1646 07-16-17 2114  TROPONINI 0.80* 0.91* 0.68* 2.93* 5.41*   Sepsis Labs:  Recent Labs Lab 07/04/2017 1637 06/23/2017 0411 07/13/17 0401 07-16-2017 0433  WBC 21.2* 13.7* 10.4 9.0    Procedures/Operations  Pacemaker placement Central venous catheter placement Foley catheter   Chisa Kushner S. [facility] ANP-BC Pulmonary and Critical Care Medicine [facility] Pager [phone] or [phone]  07/15/2017, 9:36 AM

## 2017-07-18 DEATH — deceased

## 2017-07-30 ENCOUNTER — Other Ambulatory Visit (HOSPITAL_COMMUNITY)

## 2017-08-01 ENCOUNTER — Other Ambulatory Visit: Payer: Self-pay | Admitting: Nurse Practitioner

## 2017-08-02 ENCOUNTER — Inpatient Hospital Stay: Payer: Medicare Other

## 2017-08-02 ENCOUNTER — Ambulatory Visit: Admitting: Oncology

## 2017-09-04 IMAGING — US US EXTREM  UP VENOUS*R*
1 series · 13 of 24 positions shown · non-contrast
Comparison: None

CLINICAL DATA: RIGHT upper extremity swelling



[Series 1: us extrem up venous*right* · 13 of 42 slices shown]
[im 1/42]
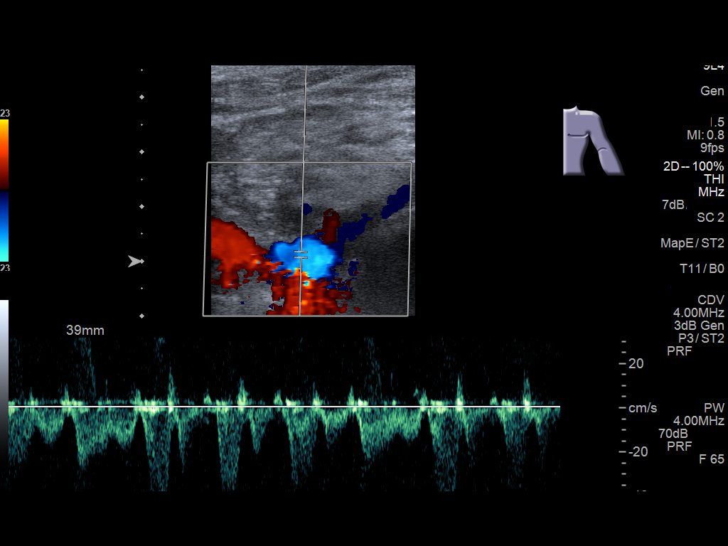
[im 4/42]
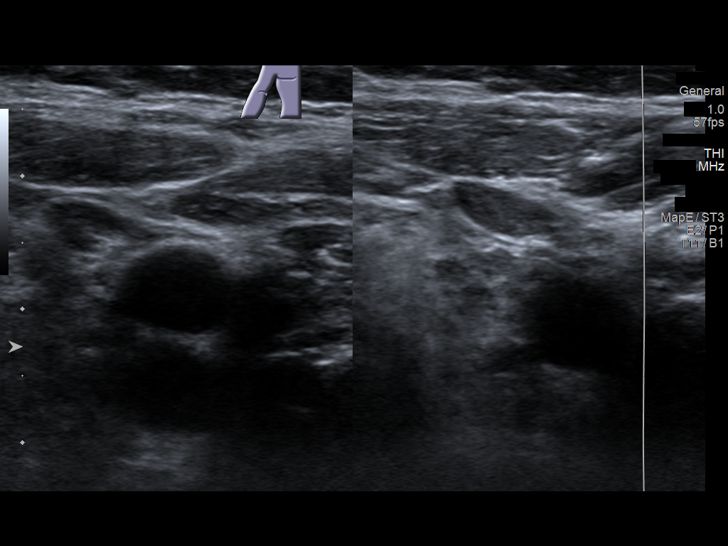
[im 8/42]
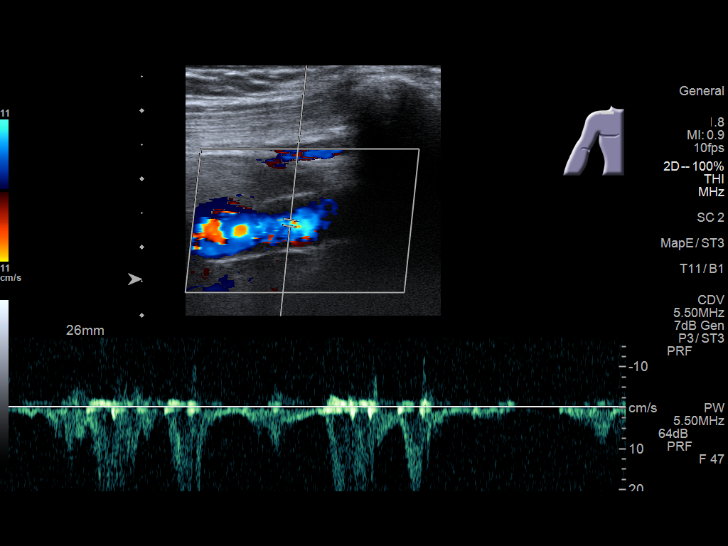
[im 11/42]
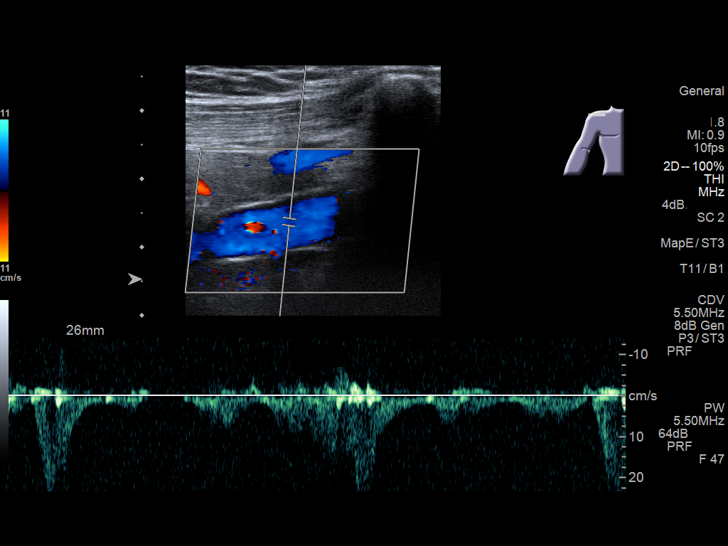
[im 15/42]
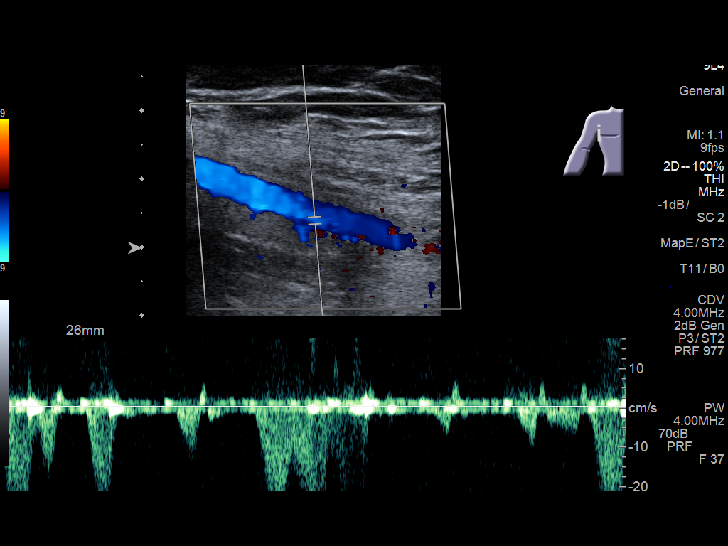
[im 18/42]
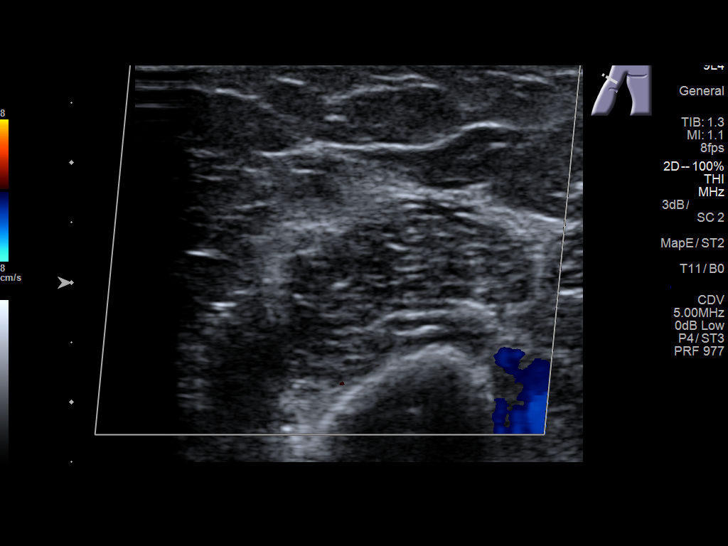
[im 22/42]
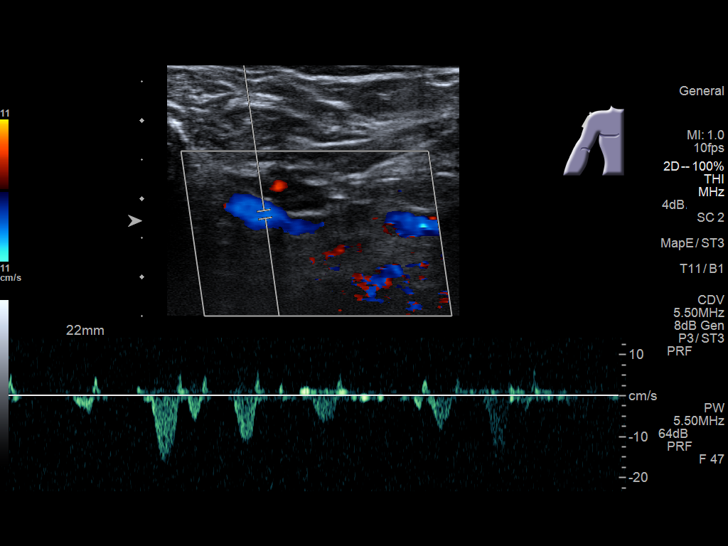
[im 24/42]
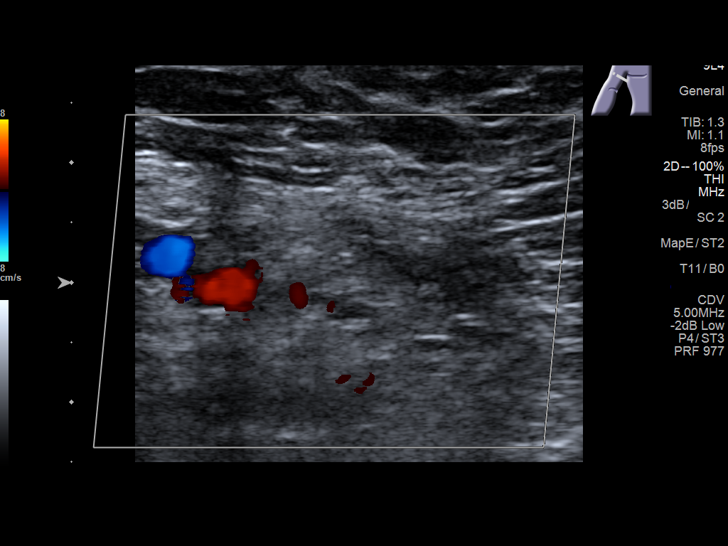
[im 27/42]
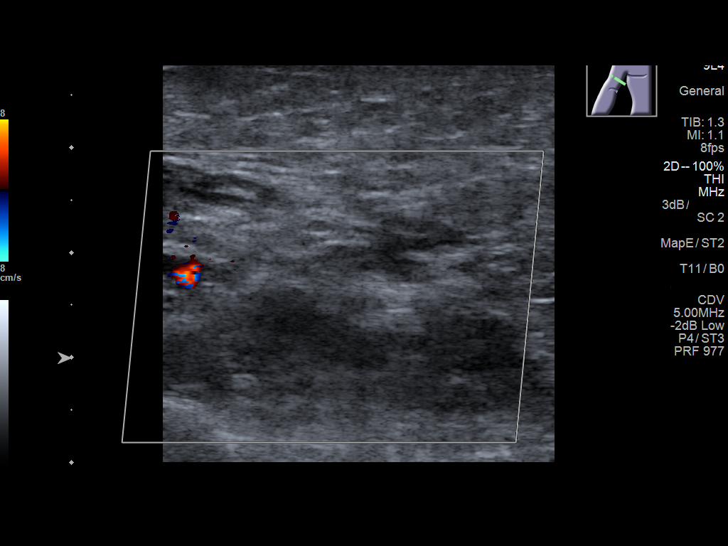
[im 31/42]
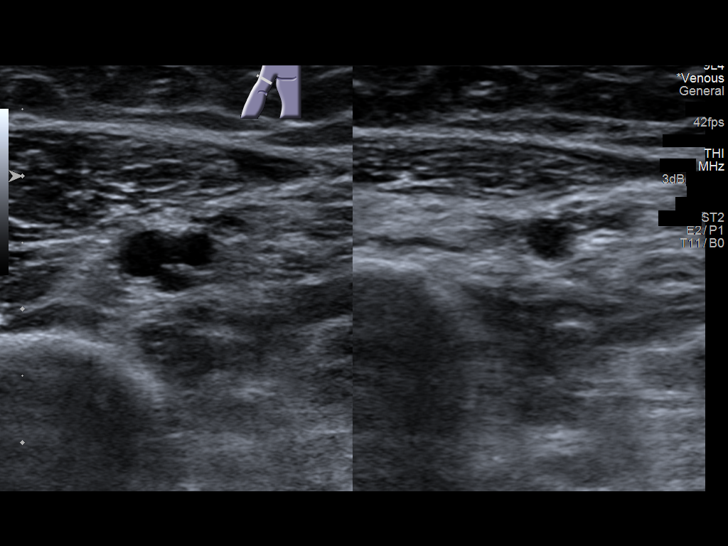
[im 34/42]
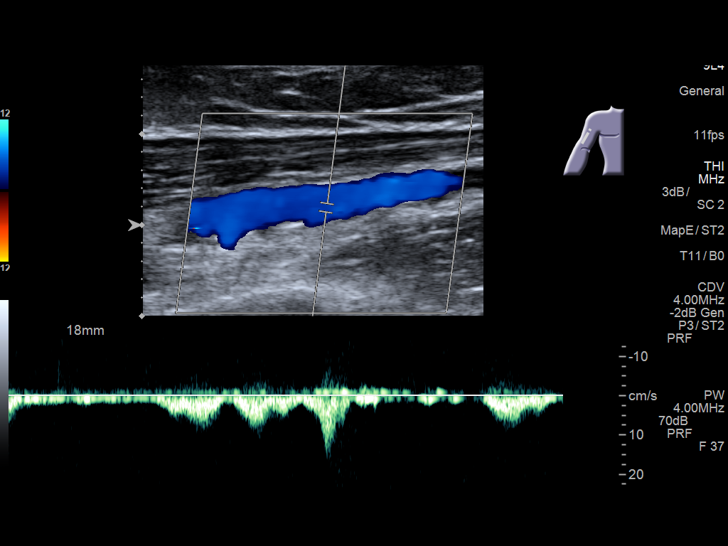
[im 38/42]
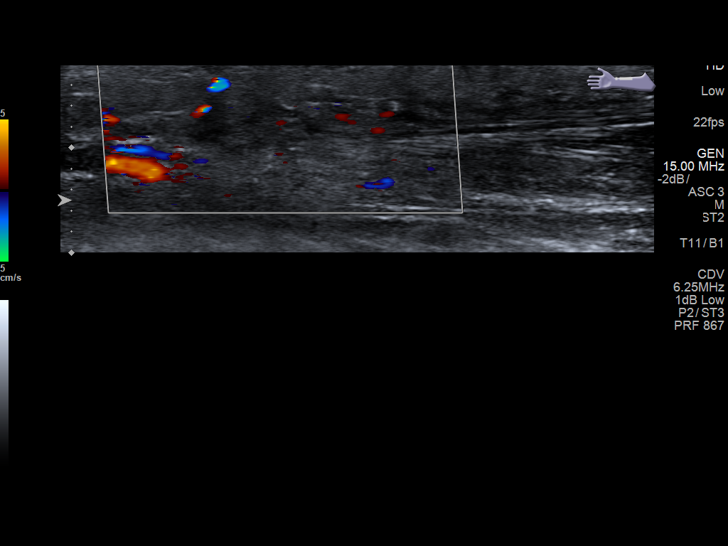
[im 42/42]
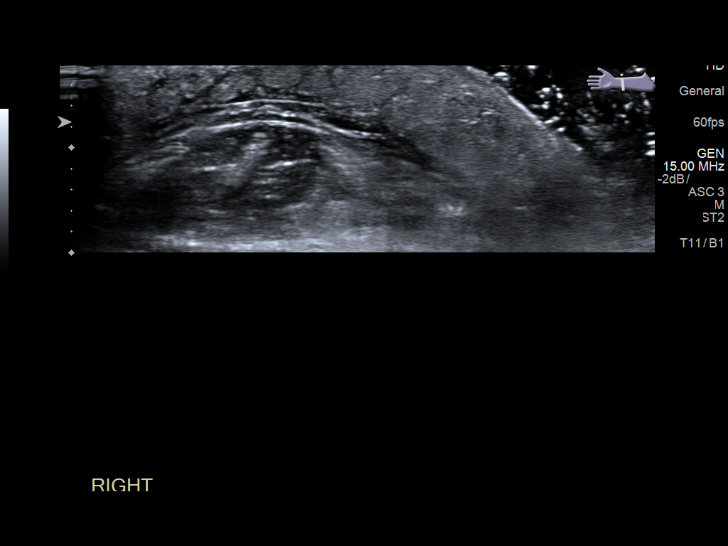

[13 of 24 positions shown; findings below may reference images not displayed]

FINDINGS: Contralateral Subclavian Vein: Respiratory phasicity is normal and
symmetric with the symptomatic side. No evidence of thrombus. Normal
compressibility.

Internal Jugular Vein: No evidence of thrombus. Normal
compressibility, respiratory phasicity and response to augmentation.

Subclavian Vein: No evidence of thrombus. Normal compressibility,
respiratory phasicity and response to augmentation.

Axillary Vein: No evidence of thrombus. Normal compressibility,
respiratory phasicity and response to augmentation.

Cephalic Vein: No evidence of thrombus in the visualized proximal
portion. Distal portion is not visualized. Normal compressibility,
respiratory phasicity and response to augmentation.

Basilic Vein: Not seen

Brachial Veins: No evidence of thrombus. Normal compressibility,
respiratory phasicity and response to augmentation.

Radial Veins: No evidence of thrombus. Normal compressibility,
respiratory phasicity and response to augmentation.

Ulnar Veins: No evidence of thrombus. Normal compressibility,
respiratory phasicity and response to augmentation.

Venous Reflux:  None visualized.

Other Findings:  Subcutaneous edema at wrist.
IMPRESSION: No evidence of DVT within the RIGHT upper extremity.

RIGHT basilic vein was not visualized.

## 2017-09-04 IMAGING — US US EXTREM LOW VENOUS*R*
1 series · 13 of 24 positions shown · non-contrast
Comparison: Ultrasound July 05, 2017.

CLINICAL DATA: Acute right lower extremity swelling.



[Series 1: us extrem low venous*right* · 0.06mm/px · 13 of 40 slices shown]
[im 1/40]
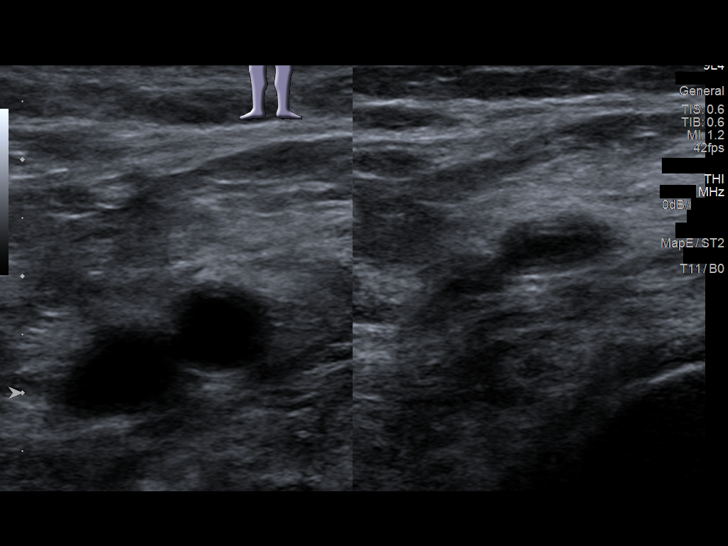
[im 4/40]
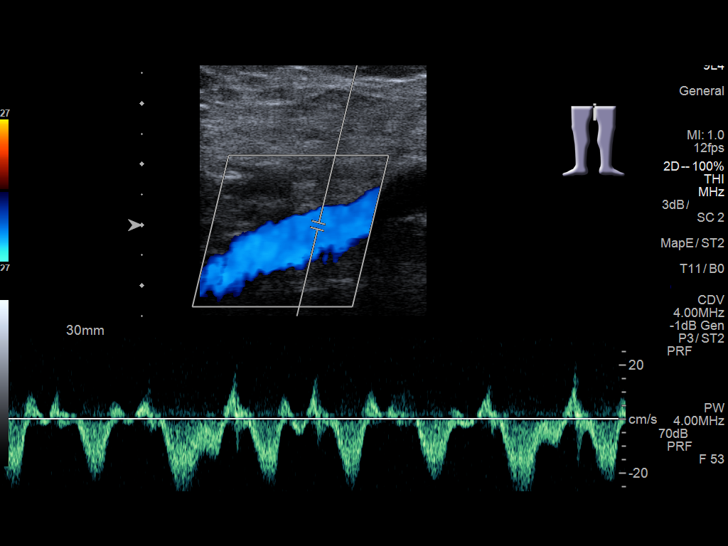
[im 7/40]
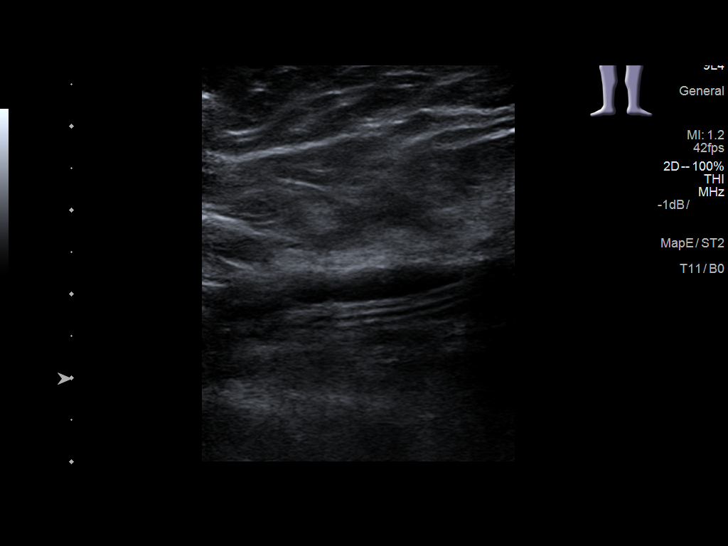
[im 11/40]
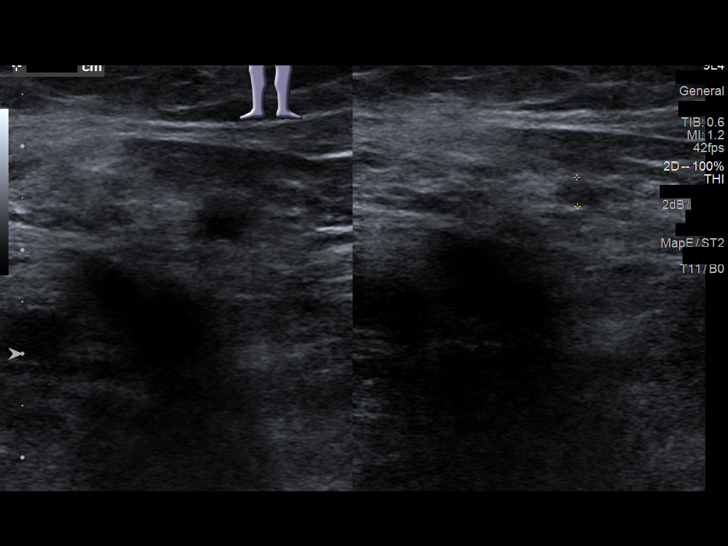
[im 14/40]
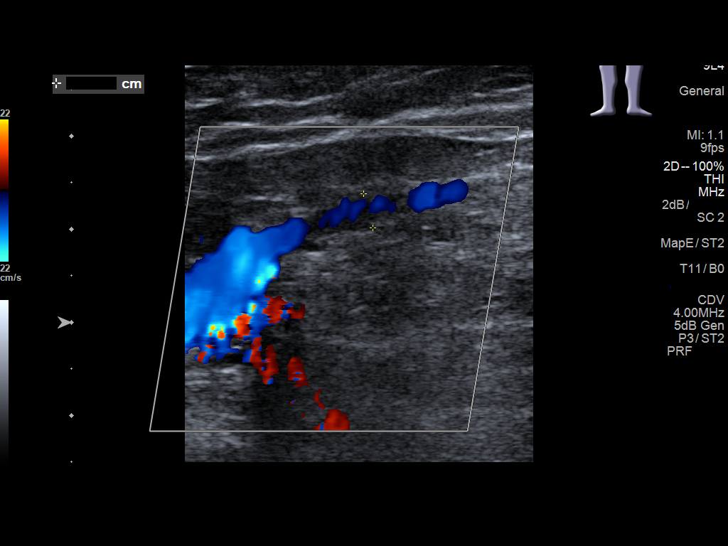
[im 17/40]
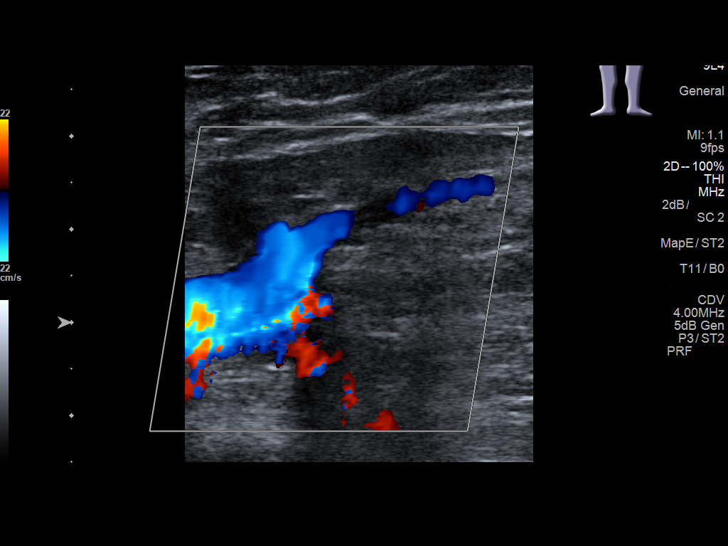
[im 21/40]
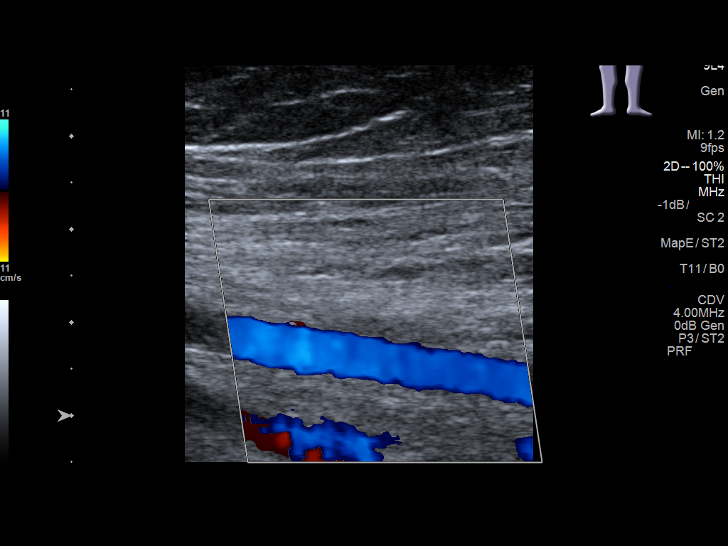
[im 23/40]
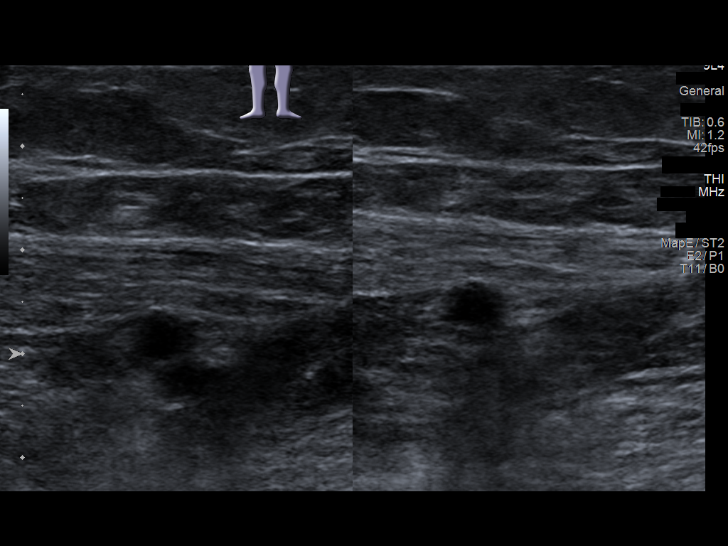
[im 26/40]
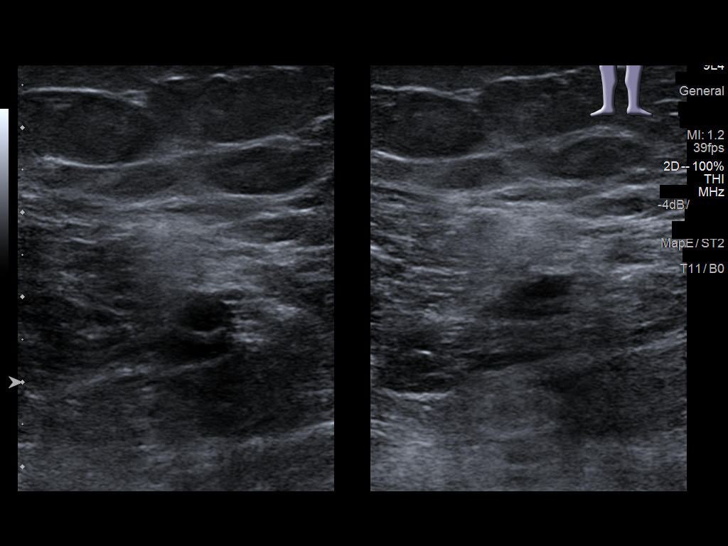
[im 29/40]
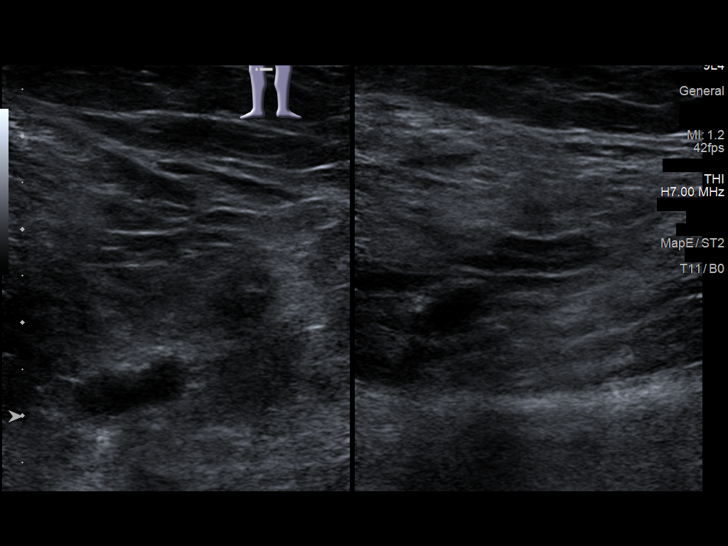
[im 33/40]
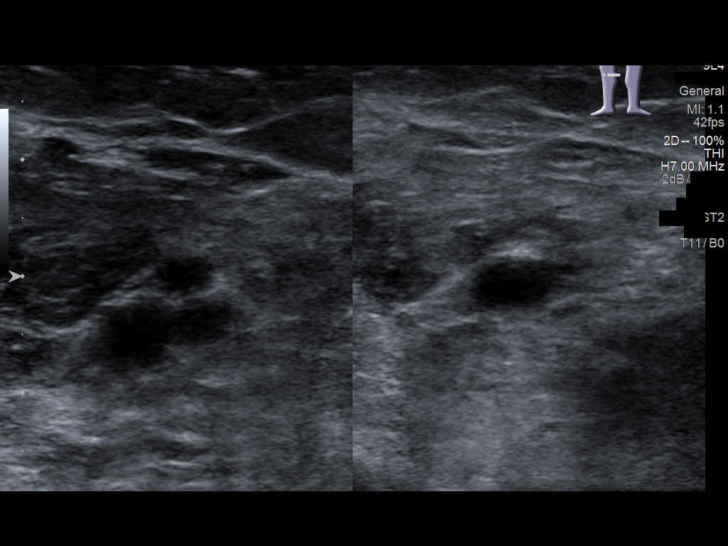
[im 36/40]
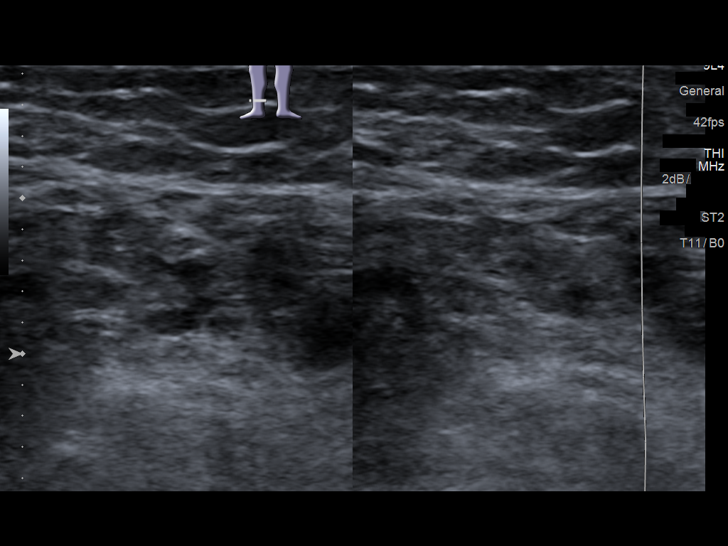
[im 40/40]
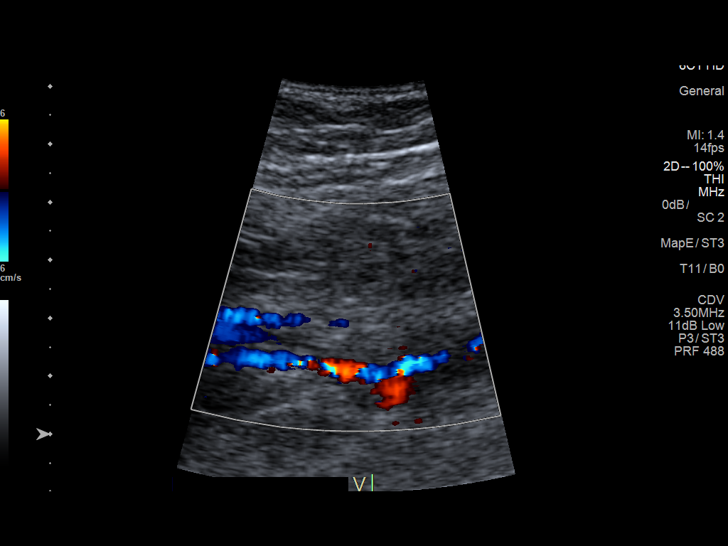

[13 of 24 positions shown; findings below may reference images not displayed]

FINDINGS: Contralateral Common Femoral Vein: Respiratory phasicity is normal
and symmetric with the symptomatic side. No evidence of thrombus.
Normal compressibility.

Common Femoral Vein: No evidence of thrombus. Normal
compressibility, respiratory phasicity and response to augmentation.

Saphenofemoral Junction: There appears to be nonocclusive thrombus
in the right greater saphenous vein near its junction with the
femoral vein, but does not extend into the right common femoral
vein.

Profunda Femoral Vein: No evidence of thrombus. Normal
compressibility and flow on color Doppler imaging.

Femoral Vein: No evidence of thrombus. Normal compressibility,
respiratory phasicity and response to augmentation.

Popliteal Vein: No evidence of thrombus. Normal compressibility,
respiratory phasicity and response to augmentation.

Calf Veins: No evidence of thrombus. Normal compressibility and flow
on color Doppler imaging.

Superficial Great Saphenous Vein: As described above, nonocclusive
thrombus is seen near the saphenofemoral junction.

Venous Reflux:  None.

Other Findings:  None.
IMPRESSION: Nonocclusive thrombus seen in the right greater saphenous vein near
the saphenofemoral junction, but not seen to extend into the right
common femoral vein. Given the proximity of the thrombus to the
saphenofemoral junction, this is considered deep venous thrombosis.
No other thrombus is noted. These results will be called to the
ordering clinician or representative by the Radiologist Assistant,
and communication documented in the PACS or zVision Dashboard.

## 2018-04-28 IMAGING — CT CT ANGIO CHEST
2 of 6 series · 18 of 46 positions shown · IV contrast (APPLIED)
Comparison: 1.

CLINICAL DATA: Dyspnea and tachycardia. Elevated white blood cell
count. History of gastric carcinoid tumor.

EXAM:
CT ANGIOGRAPHY CHEST WITH CONTRAST
TECHNIQUE: Multidetector CT imaging of the chest was performed using the
standard protocol during bolus administration of intravenous
contrast. Multiplanar CT image reconstructions and MIPs were
obtained to evaluate the vascular anatomy.
CONTRAST:  70 mL Isovue 370 IV

[Series 5: thins · axial · 0.59mm/px · z∈[-682,-440]mm · 16 of 266 slices shown]
[im 12/266  lung]
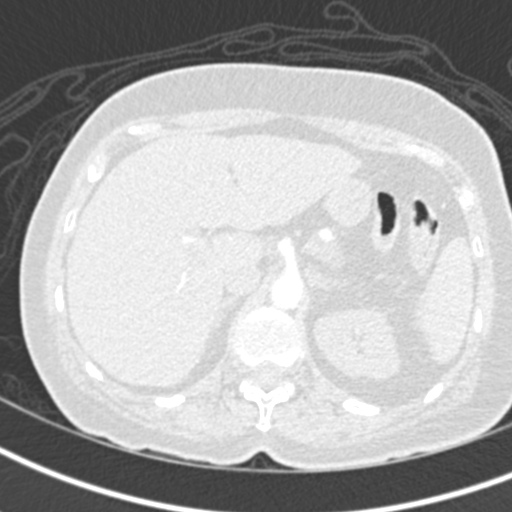
[im 35/266  soft-tissue]
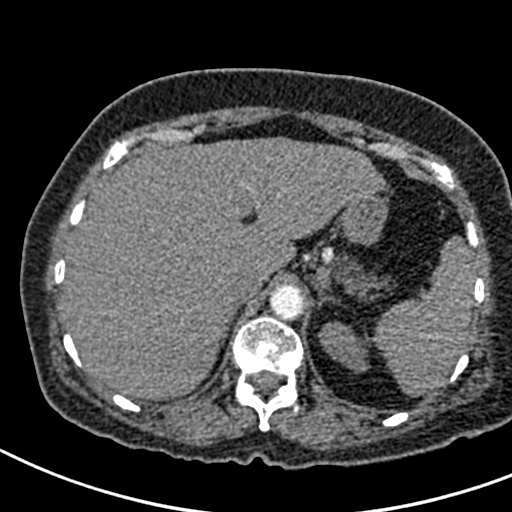
[im 47/266  lung]
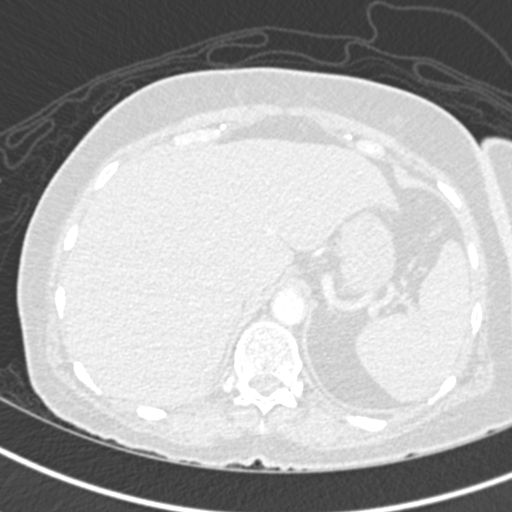
[im 58/266  soft-tissue]
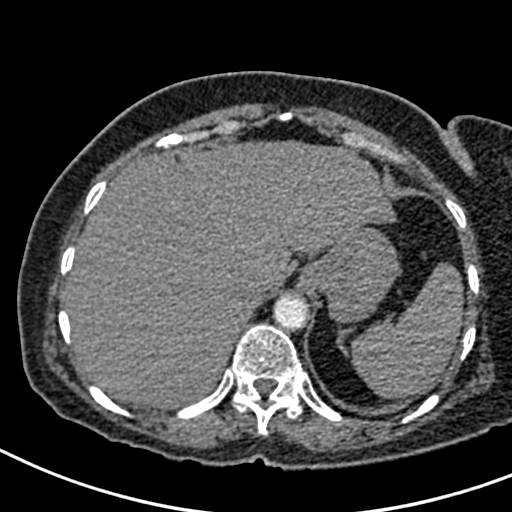
[im 81/266  lung]
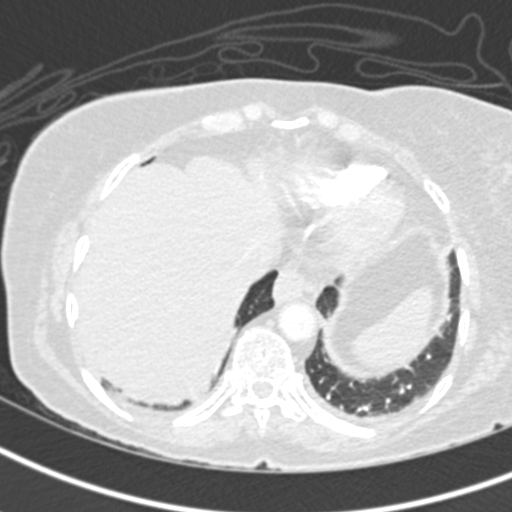
[im 93/266  soft-tissue]
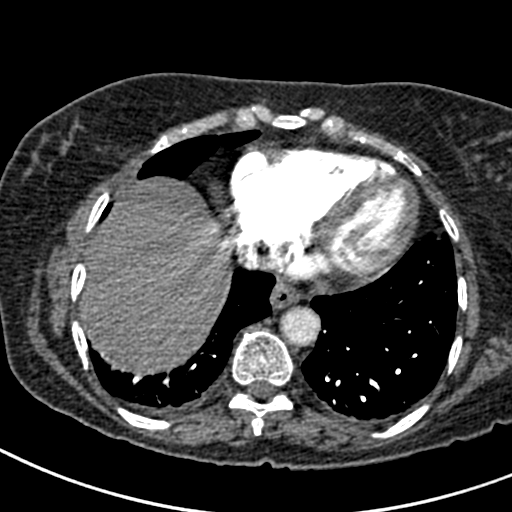
[im 104/266  lung]
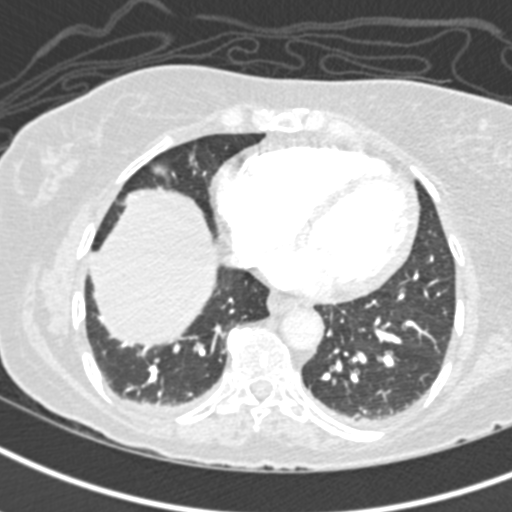
[im 127/266  soft-tissue]
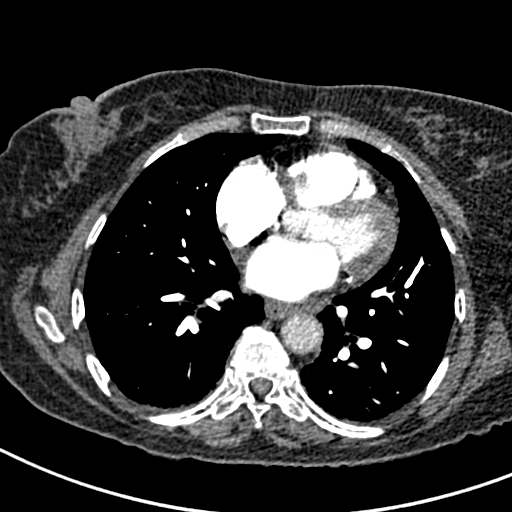
[im 139/266  lung]
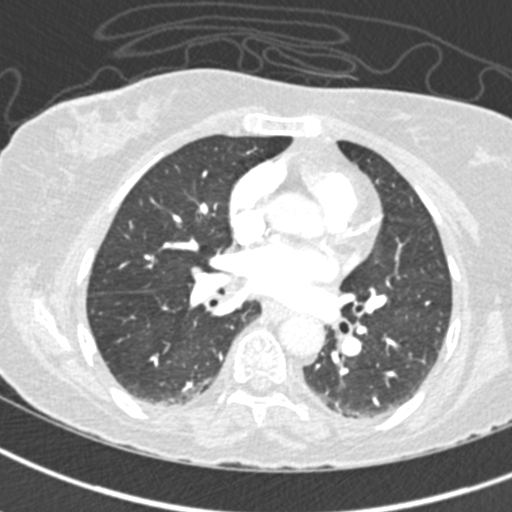
[im 162/266  soft-tissue]
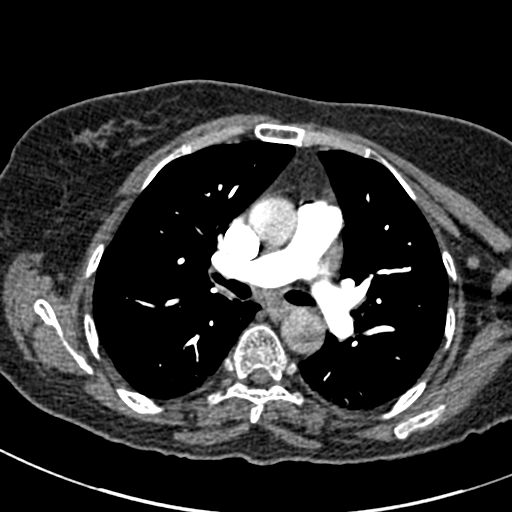
[im 173/266  lung]
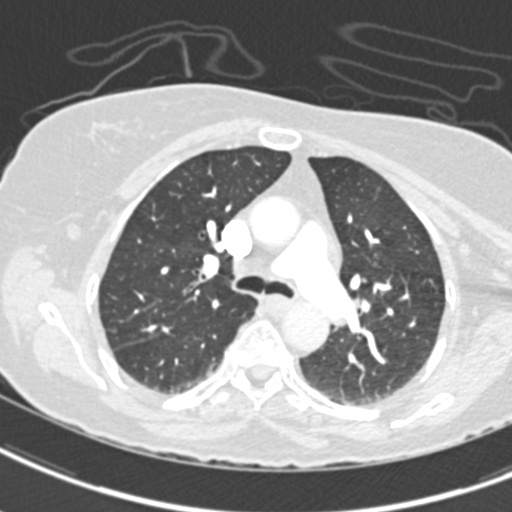
[im 185/266  soft-tissue]
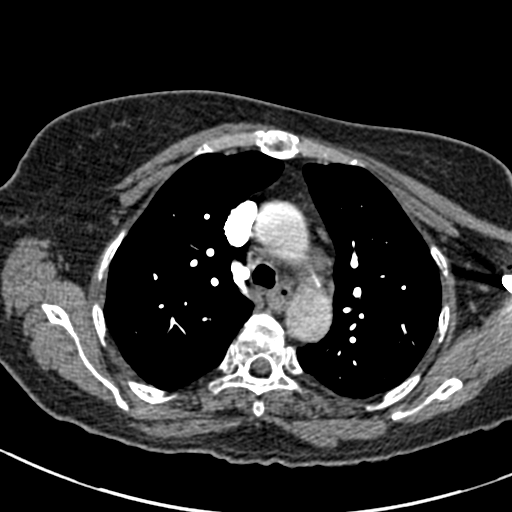
[im 208/266  lung]
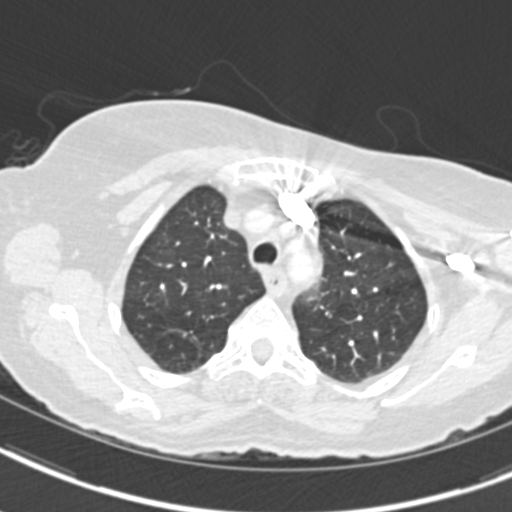
[im 219/266  soft-tissue]
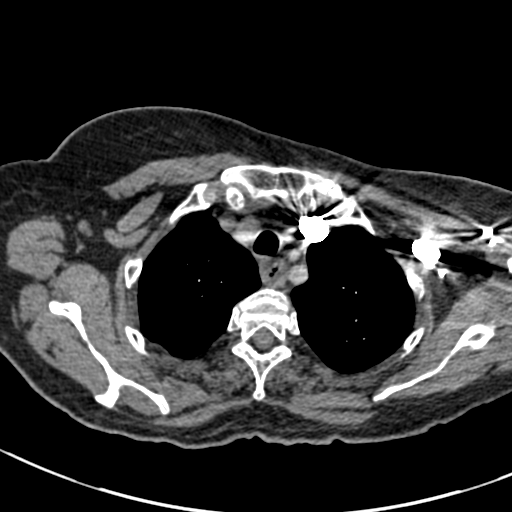
[im 231/266  lung]
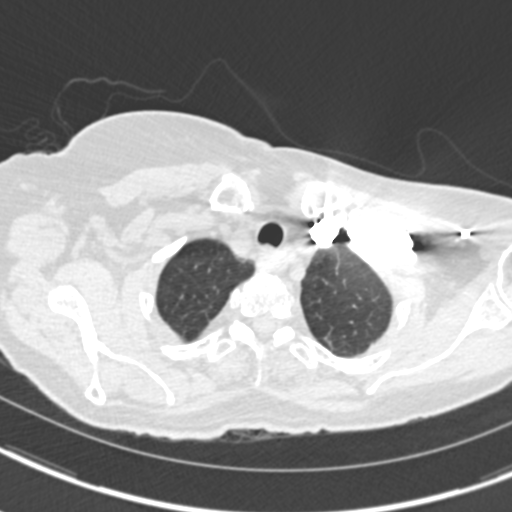
[im 254/266  soft-tissue]
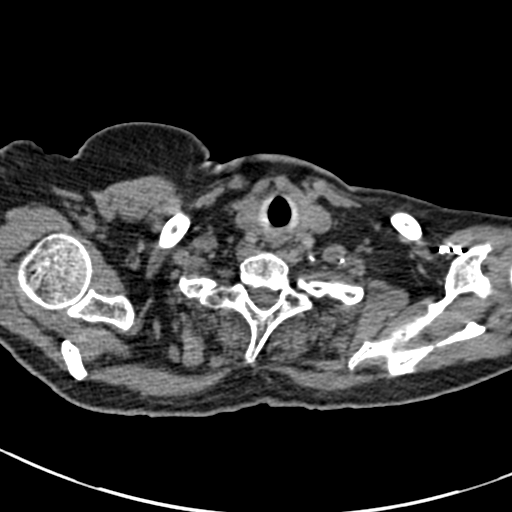

[Series 7: coronal mpr · coronal · 0.52mm/px · 2 of 78 slices shown]
[im 26/78  soft-tissue]
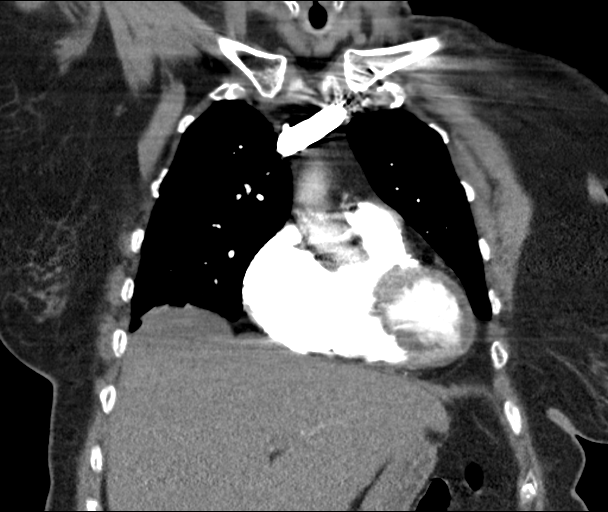
[im 52/78  soft-tissue]
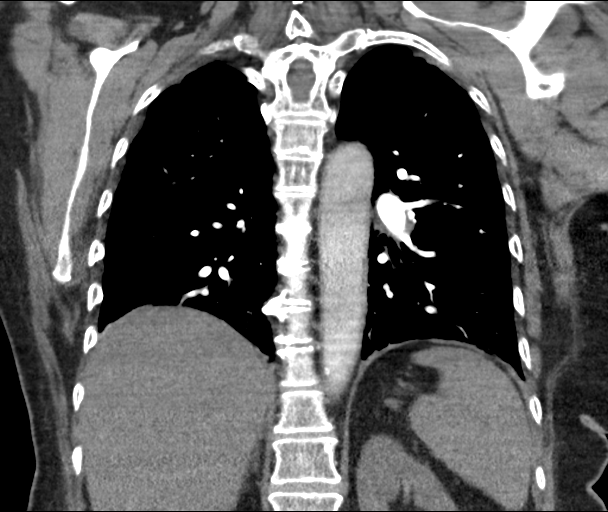

[18 of 46 positions shown; findings below may reference images not displayed]

Chest radiograph 07/05/2017
[DATE]. PET CT 04/03/2017
[DATE]. CT chest 03/07/2017
FINDINGS: Cardiovascular: Contrast injection is sufficient to demonstrate
satisfactory opacification of the pulmonary arteries to the
segmental level. There is no pulmonary embolus. The main pulmonary
artery is within normal limits for size. There is no CT evidence of
acute right heart strain. The visualized aorta is normal. There is a
normal 3-vessel arch branching pattern. Heart size is normal,
without pericardial effusion. There is atherosclerotic calcification
in the coronary arteries.

Mediastinum/Nodes: No mediastinal, hilar or axillary
lymphadenopathy. The visualized thyroid and thoracic esophageal
course are unremarkable.

Lungs/Pleura: No pulmonary nodules or masses. No pleural effusion or
pneumothorax. No focal airspace consolidation. No focal pleural
abnormality.

Upper Abdomen: Contrast bolus timing is not optimized for evaluation
of the abdominal organs. Known hepatic metastases are not well
characterized on this study.

Musculoskeletal: No chest wall abnormality. No acute or significant
osseous findings.

Review of the MIP images confirms the above findings.
IMPRESSION: 1. No pulmonary embolus.
2. No other acute thoracic abnormality.
3. Hepatic metastases demonstrated on prior PET CT are not visible
on this study.

## 2018-05-07 IMAGING — DX DG CHEST 1V PORT
1 series · 1 of 1 positions shown · non-contrast
Comparison: 07/11/2017

CLINICAL DATA: Respiratory failure.

EXAM:
PORTABLE CHEST 1 VIEW

[chest ap]
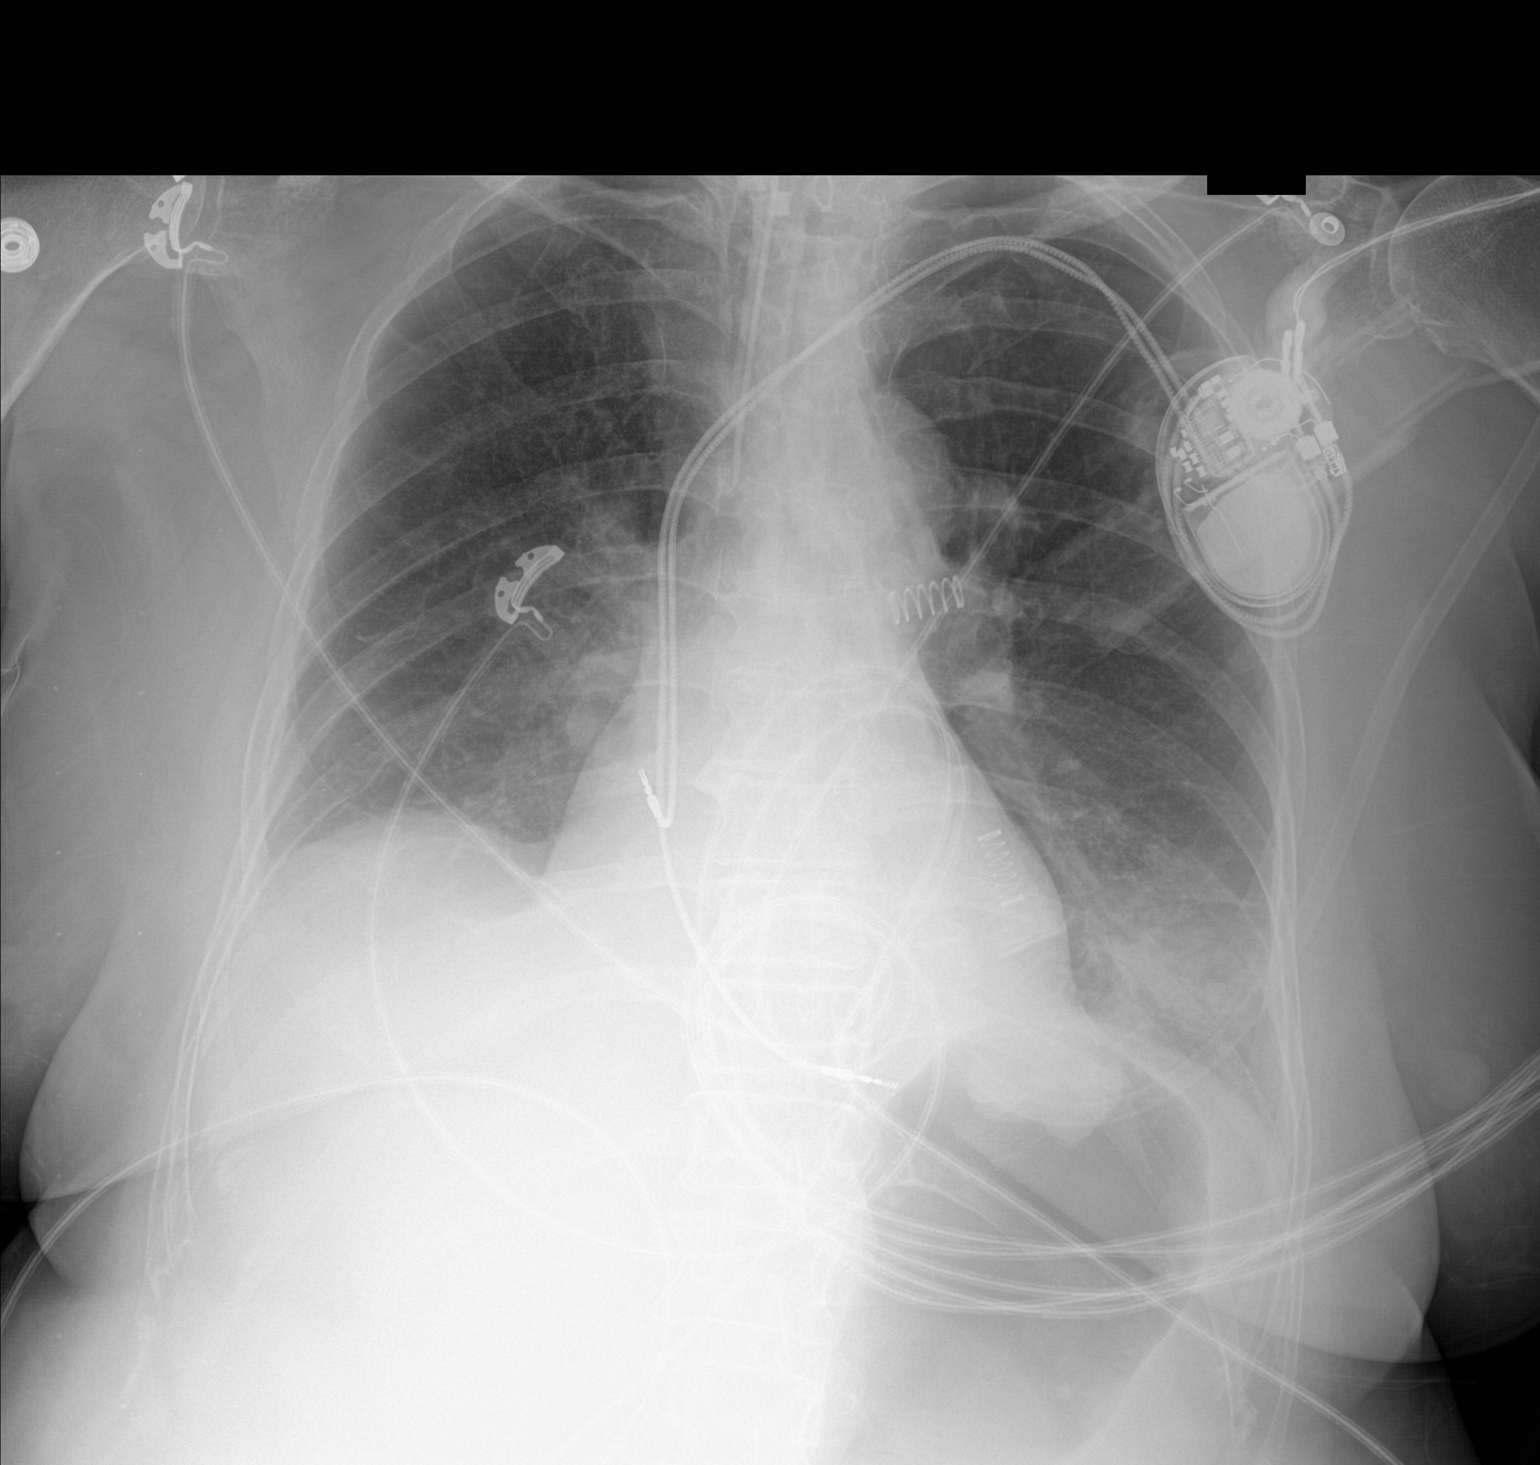

[1 of 1 positions shown; findings below may reference images not displayed]

FINDINGS: Left chest wall pacer device is noted with lead in the right atrial
appendage and right ventricle. Normal heart size. Improvement in
bilateral pleural effusions. The endotracheal tube tip is above the
carina. Diffuse interstitial edema pattern is slightly improved from
previous exam. There is a progressive airspace opacity noted in the
left lower lobe.
IMPRESSION: 1. Improvement in pulmonary edema and pleural effusions.
2. Progressive airspace opacities noted in the left base which may
represent pneumonia.
